# Patient Record
Sex: Female | Born: 1970
Health system: Southern US, Community
[De-identification: ages and names within clinical notes are randomized; demographics above are authoritative.]

## PROBLEM LIST (undated history)

## (undated) DIAGNOSIS — A64 Unspecified sexually transmitted disease: Secondary | ICD-10-CM

## (undated) HISTORY — DX: Unspecified sexually transmitted disease: A64

---

## 1999-02-13 ENCOUNTER — Emergency Department (HOSPITAL_COMMUNITY): Admission: EM | Admit: 1999-02-13 | Discharge: 1999-02-13 | Payer: Self-pay | Admitting: *Deleted

## 2003-12-04 ENCOUNTER — Other Ambulatory Visit: Admission: RE | Admit: 2003-12-04 | Discharge: 2003-12-04 | Payer: Self-pay | Admitting: Obstetrics and Gynecology

## 2005-01-27 ENCOUNTER — Other Ambulatory Visit: Admission: RE | Admit: 2005-01-27 | Discharge: 2005-01-27 | Payer: Self-pay | Admitting: Obstetrics and Gynecology

## 2006-05-13 ENCOUNTER — Other Ambulatory Visit: Admission: RE | Admit: 2006-05-13 | Discharge: 2006-05-13 | Payer: Self-pay | Admitting: Obstetrics & Gynecology

## 2007-10-27 ENCOUNTER — Other Ambulatory Visit: Admission: RE | Admit: 2007-10-27 | Discharge: 2007-10-27 | Payer: Self-pay | Admitting: Obstetrics and Gynecology

## 2007-12-29 ENCOUNTER — Other Ambulatory Visit: Admission: RE | Admit: 2007-12-29 | Discharge: 2007-12-29 | Payer: Self-pay | Admitting: Obstetrics and Gynecology

## 2011-08-10 ENCOUNTER — Other Ambulatory Visit: Payer: Self-pay | Admitting: Obstetrics and Gynecology

## 2011-08-10 DIAGNOSIS — Z1231 Encounter for screening mammogram for malignant neoplasm of breast: Secondary | ICD-10-CM

## 2011-08-19 ENCOUNTER — Ambulatory Visit
Admission: RE | Admit: 2011-08-19 | Discharge: 2011-08-19 | Disposition: A | Discharge status: Home / Self Care | Payer: PRIVATE HEALTH INSURANCE | Source: Ambulatory Visit | Attending: Obstetrics and Gynecology | Admitting: Obstetrics and Gynecology

## 2011-08-19 DIAGNOSIS — Z1231 Encounter for screening mammogram for malignant neoplasm of breast: Secondary | ICD-10-CM

## 2012-07-13 ENCOUNTER — Other Ambulatory Visit: Payer: Self-pay

## 2012-07-13 DIAGNOSIS — Z1231 Encounter for screening mammogram for malignant neoplasm of breast: Secondary | ICD-10-CM

## 2012-08-21 ENCOUNTER — Ambulatory Visit
Admission: RE | Admit: 2012-08-21 | Discharge: 2012-08-21 | Disposition: A | Discharge status: Home / Self Care | Payer: BC Managed Care – PPO | Source: Ambulatory Visit

## 2012-08-21 DIAGNOSIS — Z1231 Encounter for screening mammogram for malignant neoplasm of breast: Secondary | ICD-10-CM

## 2013-02-08 ENCOUNTER — Encounter: Payer: Self-pay | Admitting: Obstetrics and Gynecology

## 2013-02-09 ENCOUNTER — Ambulatory Visit (INDEPENDENT_AMBULATORY_CARE_PROVIDER_SITE_OTHER): Payer: BC Managed Care – PPO | Admitting: Nurse Practitioner

## 2013-02-09 ENCOUNTER — Encounter: Payer: Self-pay | Admitting: Nurse Practitioner

## 2013-02-09 VITALS — BP 100/72 | HR 72 | Resp 16 | Ht 67.0 in | Wt 175.0 lb

## 2013-02-09 DIAGNOSIS — Z01419 Encounter for gynecological examination (general) (routine) without abnormal findings: Secondary | ICD-10-CM

## 2013-02-09 NOTE — Patient Instructions (Signed)

## 2013-02-09 NOTE — Progress Notes (Signed)
Patient ID: Lauren Stevens, female   DOB: July 27, 1970, 42 y.o.   MRN: [id number] 42 y.o. [id number] Single Caucasian Fe here for annual exam.  Same partner for 15 years.  Not dating or sexually active in 2 years.  Patient's last menstrual period was 01/15/2013.          Sexually active: no  The current method of family planning is none.    Exercising: yes  Gym/ health club routine includes cardio and light weights.  Works with Psychologist, educational. Smoker:  no  Health Maintenance: Pap: 02/04/12, WNL, neg HR HPV MMG: 08/21/12, Bi--Rads 1: negative TDaP: 2014 Labs: PCP/work   reports that she has never smoked. She has never used smokeless tobacco. She reports that she drinks about 2.5 ounces of alcohol per week. She reports that she does not use illicit drugs.  Past Medical History  Diagnosis Date  . STD (sexually transmitted disease)     HSV 2    History reviewed. No pertinent past surgical history.  No current outpatient prescriptions on file.   No current facility-administered medications for this visit.    Family History  Problem Relation Age of Onset  . Diabetes Mother   . Hypertension Mother   . Hyperlipidemia Mother   . Heart failure Father   . Diabetes Father     ROS:  Pertinent items are noted in HPI.  Otherwise, a comprehensive ROS was negative.  Exam:   BP 100/72  Pulse 72  Resp 16  Ht 5\' 7"  (1.702 m)  Wt 175 lb (79.379 kg)  BMI 27.40 kg/m2  LMP 01/15/2013 Height: 5\' 7"  (170.2 cm)  Ht Readings from Last 3 Encounters:  02/09/13 5\' 7"  (1.702 m)    General appearance: alert, cooperative and appears stated age Head: Normocephalic, without obvious abnormality, atraumatic Neck: no adenopathy, supple, symmetrical, trachea midline and thyroid normal to inspection and palpation Lungs: clear to auscultation bilaterally Breasts: normal appearance, no masses or tenderness Heart: regular rate and rhythm Abdomen: soft, non-tender; no masses,  no organomegaly Extremities:  extremities normal, atraumatic, no cyanosis or edema Skin: Skin color, texture, turgor normal. No rashes or lesions Lymph nodes: Cervical, supraclavicular, and axillary nodes normal. No abnormal inguinal nodes palpated Neurologic: Grossly normal   Pelvic: External genitalia:  no lesions              Urethra:  normal appearing urethra with no masses, tenderness or lesions              Bartholin's and Skene's: normal                 Vagina: normal appearing vagina with normal color and discharge, no lesions              Cervix: anteverted              Pap taken: no Bimanual Exam:  Uterus:  normal size, contour, position, consistency, mobility, non-tender              Adnexa: no mass, fullness, tenderness               Rectovaginal: Confirms               Anus:  normal sphincter tone, no lesions  A:  Well Woman with normal exam  P:   Pap smear as per guidelines   Mammogram due 08/2013  Keep menses record  Counseled on breast self exam, adequate intake of calcium and vitamin D, diet and exercise  return annually or prn  An After Visit Summary was printed and given to the patient.

## 2013-02-14 NOTE — Progress Notes (Signed)
Encounter reviewed by Dr. Vander Kueker Silva.  

## 2013-07-30 ENCOUNTER — Other Ambulatory Visit: Payer: Self-pay

## 2013-07-30 DIAGNOSIS — Z1231 Encounter for screening mammogram for malignant neoplasm of breast: Secondary | ICD-10-CM

## 2013-08-24 ENCOUNTER — Ambulatory Visit: Payer: BC Managed Care – PPO

## 2013-09-04 ENCOUNTER — Ambulatory Visit: Payer: BC Managed Care – PPO

## 2013-10-02 ENCOUNTER — Other Ambulatory Visit: Payer: Self-pay | Admitting: Nurse Practitioner

## 2013-10-02 MED ORDER — VALACYCLOVIR HCL 500 MG PO TABS: 500.0000 mg | ORAL_TABLET | Freq: Two times a day (BID) | ORAL | Status: DC

## 2013-10-02 NOTE — Telephone Encounter (Signed)
Pt is requesting a refill for valtrex to be sent to [facility] [address]

## 2013-10-02 NOTE — Telephone Encounter (Signed)
Last AEX: 02/09/13 AEX scheduled: 02/23/15 with Ms Alexia Freestoneatty Last refilled: 04/07/11 #30/6 refills  Please advise  (Chart outside your door)

## 2013-10-03 NOTE — Telephone Encounter (Signed)
LM on patient's VM that rx has been sent to the pharmacy.

## 2014-01-29 ENCOUNTER — Ambulatory Visit: Payer: BC Managed Care – PPO | Admitting: Podiatry

## 2014-01-31 ENCOUNTER — Encounter: Payer: Self-pay | Admitting: Podiatry

## 2014-01-31 ENCOUNTER — Ambulatory Visit (INDEPENDENT_AMBULATORY_CARE_PROVIDER_SITE_OTHER): Payer: BC Managed Care – PPO | Admitting: Podiatry

## 2014-01-31 ENCOUNTER — Ambulatory Visit (INDEPENDENT_AMBULATORY_CARE_PROVIDER_SITE_OTHER): Payer: BC Managed Care – PPO

## 2014-01-31 VITALS — BP 103/71 | HR 80 | Resp 13 | Ht 67.0 in | Wt 180.0 lb

## 2014-01-31 DIAGNOSIS — M722 Plantar fascial fibromatosis: Secondary | ICD-10-CM | POA: Diagnosis not present

## 2014-01-31 MED ORDER — MELOXICAM 15 MG PO TABS: 15.0000 mg | ORAL_TABLET | Freq: Every day | ORAL | Status: DC

## 2014-01-31 MED ORDER — METHYLPREDNISOLONE (PAK) 4 MG PO TABS: ORAL_TABLET | ORAL | Status: DC

## 2014-01-31 NOTE — Progress Notes (Signed)
   Subjective:    Patient ID: Lauren CunasMichelle L Tamburo, female    DOB: 03/01/1971, 43 y.o.   MRN: [id number]  HPI Comments: Pt state she has had right plantar posterior heel pain for 3 months.  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     Review of Systems  Constitutional: Positive for unexpected weight change.  All other systems reviewed and are negative.      Objective:   Physical Exam: I have reviewed her past medical history medications allergy surgery social history and review of systems. Ulcers are strongly palpable bilateral. Neurologic sensorium is intact percent lasting monofilament. Deep tendon reflexes are intact bilateral. Muscle strength is 5 over 5 dorsiflexion plantar flexors and inverters everters all intrinsic musculature is intact. She has pain on palpation medial continue tubercle of the right heel. No pain on palpation of the tendo Achilles. No pain on medial and lateral compression of the calcaneus. Radiographic evaluation demonstrates soft tissue increase in density at the plantar fascial calcaneal insertion site of the right heel. No other major osseous abnormalities are noted. Cutaneous evaluation demonstrates supple well-hydrated cutis no erythema edema cellulitis drainage or odor.        Assessment & Plan:  Assessment: Plantar fasciitis right.  Plan: Discussed etiology pathology conservative versus surgical therapies. We discussed appropriate shoe gear stretching exercises ice therapy and shoe gear modifications. Injected the right heel today with Kenalog and local anesthetic. Placed her in a plantar fascial brace and dispensed a night splint. Wrote a prescription for Medrol Dosepak to be followed by meloxicam. She will call with any questions or concerns I will follow-up with her proximally one month.

## 2014-02-04 ENCOUNTER — Encounter: Payer: Self-pay | Admitting: Podiatry

## 2014-02-22 ENCOUNTER — Ambulatory Visit: Payer: BC Managed Care – PPO | Admitting: Nurse Practitioner

## 2014-02-22 ENCOUNTER — Telehealth: Payer: Self-pay | Admitting: Nurse Practitioner

## 2014-02-22 NOTE — Telephone Encounter (Signed)
Patient missed her appointment due to the fact she was dealing with her mother at the hospital who just had one of her kidneys removed. Patient to call back to reschedule and she is in recall.

## 2014-02-22 NOTE — Telephone Encounter (Signed)
This is an annual exam appointment. Recall is for annual exam only.  Routing to AshlandPatricia Rolen-Grubb, FNP for update.  Will close encounter.  Routing to provider for final review. Patient agreeable to disposition. Will close encounter

## 2014-03-07 ENCOUNTER — Ambulatory Visit: Payer: BC Managed Care – PPO | Admitting: Podiatry

## 2014-03-12 ENCOUNTER — Ambulatory Visit: Payer: BC Managed Care – PPO | Admitting: Podiatry

## 2014-04-25 ENCOUNTER — Encounter: Payer: Self-pay | Admitting: Podiatry

## 2014-04-25 ENCOUNTER — Ambulatory Visit (INDEPENDENT_AMBULATORY_CARE_PROVIDER_SITE_OTHER): Payer: BLUE CROSS/BLUE SHIELD | Admitting: Podiatry

## 2014-04-25 VITALS — BP 112/69 | HR 78 | Resp 12

## 2014-04-25 DIAGNOSIS — M722 Plantar fascial fibromatosis: Secondary | ICD-10-CM

## 2014-04-25 NOTE — Progress Notes (Signed)
She presents today for follow-up of plantar fasciitis to the right heel. She states that his heel sterile hurting but he feels much better after the injection.  Objective: Vital signs are stable she is alert and oriented 3 she has pain on palpation may continue tubercle of the right heel.  Assessment: Chronic intractable plantar fasciitis right heel.  Plan: I injected her right heel today with Kenalog and local anesthetic and we will get her started on orthotics next visit. She'll continue all other conservative therapies.

## 2014-05-23 ENCOUNTER — Ambulatory Visit (INDEPENDENT_AMBULATORY_CARE_PROVIDER_SITE_OTHER): Payer: BLUE CROSS/BLUE SHIELD | Admitting: Podiatry

## 2014-05-23 ENCOUNTER — Encounter: Payer: Self-pay | Admitting: Podiatry

## 2014-05-23 VITALS — BP 102/65 | HR 69 | Resp 16

## 2014-05-23 DIAGNOSIS — M722 Plantar fascial fibromatosis: Secondary | ICD-10-CM

## 2014-05-23 NOTE — Progress Notes (Signed)
She presents today for follow-up of her plantar fasciitis right foot. She states that it doesn't hurt every day but it hurts just enough to really bother me on the days that he does hurt. She states that she is no longer taking the meloxicam.  Objective: Vital signs are stable she's alert and oriented 3. Pulses are palpable. She has pain on palpation medial calcaneal tubercle of the right foot.  Assessment: Chronic intractable plantar fasciitis right foot.  Plan: She was scanned for set of orthotics today and we restarted I will meloxicam. We will continue to use all other conservative therapies that she knows to these orthotics arrive. Once they arrive we will reinject her and possibly even utilize another steroid pack to help reduce the inflammation.

## 2014-06-14 ENCOUNTER — Ambulatory Visit: Payer: BLUE CROSS/BLUE SHIELD | Admitting: *Deleted

## 2014-06-14 DIAGNOSIS — M722 Plantar fascial fibromatosis: Secondary | ICD-10-CM

## 2014-06-14 NOTE — Progress Notes (Signed)
Patient ID: Lauren Stevens, female   DOB: 08/28/1970, 44 y.o.   MRN: [id number] PICKING UP INSERTS

## 2014-06-14 NOTE — Patient Instructions (Signed)

## 2014-06-25 ENCOUNTER — Other Ambulatory Visit: Payer: Self-pay

## 2014-06-25 DIAGNOSIS — Z1231 Encounter for screening mammogram for malignant neoplasm of breast: Secondary | ICD-10-CM

## 2014-08-07 ENCOUNTER — Ambulatory Visit
Admission: RE | Admit: 2014-08-07 | Discharge: 2014-08-07 | Disposition: A | Discharge status: Home / Self Care | Payer: BLUE CROSS/BLUE SHIELD | Source: Ambulatory Visit

## 2014-08-07 DIAGNOSIS — Z1231 Encounter for screening mammogram for malignant neoplasm of breast: Secondary | ICD-10-CM

## 2014-08-08 ENCOUNTER — Other Ambulatory Visit: Payer: Self-pay | Admitting: Nurse Practitioner

## 2014-08-08 DIAGNOSIS — R928 Other abnormal and inconclusive findings on diagnostic imaging of breast: Secondary | ICD-10-CM

## 2014-08-13 ENCOUNTER — Other Ambulatory Visit: Payer: Self-pay | Admitting: Nurse Practitioner

## 2014-08-13 ENCOUNTER — Ambulatory Visit
Admission: RE | Admit: 2014-08-13 | Discharge: 2014-08-13 | Disposition: A | Discharge status: Home / Self Care | Payer: BLUE CROSS/BLUE SHIELD | Source: Ambulatory Visit | Attending: Nurse Practitioner | Admitting: Nurse Practitioner

## 2014-08-13 DIAGNOSIS — R928 Other abnormal and inconclusive findings on diagnostic imaging of breast: Secondary | ICD-10-CM

## 2014-08-15 ENCOUNTER — Other Ambulatory Visit: Payer: Self-pay | Admitting: Nurse Practitioner

## 2014-08-15 ENCOUNTER — Ambulatory Visit
Admission: RE | Admit: 2014-08-15 | Discharge: 2014-08-15 | Disposition: A | Discharge status: Home / Self Care | Payer: BLUE CROSS/BLUE SHIELD | Source: Ambulatory Visit | Attending: Nurse Practitioner | Admitting: Nurse Practitioner

## 2014-08-15 DIAGNOSIS — R928 Other abnormal and inconclusive findings on diagnostic imaging of breast: Secondary | ICD-10-CM

## 2014-10-29 ENCOUNTER — Encounter: Payer: Self-pay | Admitting: Nurse Practitioner

## 2015-01-10 ENCOUNTER — Other Ambulatory Visit: Payer: Self-pay | Admitting: Nurse Practitioner

## 2015-01-10 ENCOUNTER — Other Ambulatory Visit: Payer: Self-pay

## 2015-01-10 DIAGNOSIS — R921 Mammographic calcification found on diagnostic imaging of breast: Secondary | ICD-10-CM

## 2015-02-17 ENCOUNTER — Ambulatory Visit
Admission: RE | Admit: 2015-02-17 | Discharge: 2015-02-17 | Disposition: A | Discharge status: Home / Self Care | Payer: BLUE CROSS/BLUE SHIELD | Source: Ambulatory Visit | Attending: Nurse Practitioner | Admitting: Nurse Practitioner

## 2015-02-17 DIAGNOSIS — R921 Mammographic calcification found on diagnostic imaging of breast: Secondary | ICD-10-CM

## 2015-05-23 ENCOUNTER — Ambulatory Visit (INDEPENDENT_AMBULATORY_CARE_PROVIDER_SITE_OTHER): Payer: BLUE CROSS/BLUE SHIELD | Admitting: Nurse Practitioner

## 2015-05-23 ENCOUNTER — Encounter: Payer: Self-pay | Admitting: Nurse Practitioner

## 2015-05-23 VITALS — BP 108/64 | HR 68 | Ht 67.0 in | Wt 169.0 lb

## 2015-05-23 DIAGNOSIS — Z01419 Encounter for gynecological examination (general) (routine) without abnormal findings: Secondary | ICD-10-CM

## 2015-05-23 DIAGNOSIS — Z0000 Encounter for general adult medical examination without abnormal findings: Secondary | ICD-10-CM

## 2015-05-23 MED ORDER — NYSTATIN-TRIAMCINOLONE 100000-0.1 UNIT/GM-% EX OINT: 1.0000 "application " | TOPICAL_OINTMENT | Freq: Two times a day (BID) | CUTANEOUS | Status: DC

## 2015-05-23 MED ORDER — VALACYCLOVIR HCL 500 MG PO TABS: 500.0000 mg | ORAL_TABLET | Freq: Two times a day (BID) | ORAL | Status: DC

## 2015-05-23 MED ORDER — CLOBETASOL PROPIONATE 0.05 % EX OINT: 1.0000 "application " | TOPICAL_OINTMENT | Freq: Two times a day (BID) | CUTANEOUS | Status: DC

## 2015-05-23 NOTE — Patient Instructions (Signed)

## 2015-05-23 NOTE — Progress Notes (Signed)
Patient ID: Lauren Stevens, female   DOB: [birth year], 45 y.o.   MRN: [id number] 45 y.o. [id number] Single  Caucasian Fe here for annual exam.  There was a lapse of care from 2014 secondary to mother's illness.   Menses is still regular and last 4-5 days.   Then 1 day is very heavy with changing a super tampon every hour for about 6 hours.  No cramps.  Ended last relationship 4 yrs ago.  No concerns about STD's.   Patient's last menstrual period was 05/18/2015 (exact date).          Sexually active: Yes.  Not currently. The current method of family planning is abstinence.    Exercising: Yes.    Home exercise routine includes cardio and weights 3 times per week with trainer. Smoker:  no  Health Maintenance: Pap: 02/04/12, WNL, neg HR HPV MMG: bilateral calcifications - due for diagnostic on the left in May TDaP: 04/05/13  HIV: note given to get done fasting at her work Labs: order usually given to have drawn at place of work   reports that she has never smoked. She has never used smokeless tobacco. She reports that she drinks about 2.5 - 3.0 oz of alcohol per week. She reports that she does not use illicit drugs.  Past Medical History  Diagnosis Date  . STD (sexually transmitted disease)     HSV 2    History reviewed. No pertinent past surgical history.  Current Outpatient Prescriptions  Medication Sig Dispense Refill  . meloxicam (MOBIC) 15 MG tablet Take 1 tablet (15 mg total) by mouth daily. 30 tablet 3  . metroNIDAZOLE (METROGEL) 1 % gel Apply topically daily.    . valACYclovir (VALTREX) 500 MG tablet Take 1 tablet (500 mg total) by mouth 2 (two) times daily. 30 tablet 5  . nystatin-triamcinolone ointment (MYCOLOG) Apply 1 application topically 2 (two) times daily. 30 g 3   No current facility-administered medications for this visit.    Family History  Problem Relation Age of Onset  . Diabetes Mother   . Hypertension Mother   . Hyperlipidemia Mother   . Kidney cancer Mother     ?  removed right kidney  . Heart failure Father   . Diabetes Father     ROS:  Pertinent items are noted in HPI.  Otherwise, a comprehensive ROS was negative.  Exam:   BP 108/64 mmHg  Pulse 68  Ht  (1.702 m)  Wt 169 lb (76.658 kg)  BMI 26.46 kg/m2  LMP 05/18/2015 (Exact Date) Height:  (170.2 cm) Ht Readings from Last 3 Encounters:  05/23/15  (1.702 m)  01/31/14  (1.702 m)  02/09/13  (1.702 m)    General appearance: alert, cooperative and appears stated age Head: Normocephalic, without obvious abnormality, atraumatic Neck: no adenopathy, supple, symmetrical, trachea midline and thyroid normal to inspection and palpation Lungs: clear to auscultation bilaterally Breasts: normal appearance, no masses or tenderness Heart: regular rate and rhythm Abdomen: soft, non-tender; no masses,  no organomegaly Extremities: extremities normal, atraumatic, no cyanosis or edema Skin: Skin color, texture, turgor normal. No rashes or lesions Lymph nodes: Cervical, supraclavicular, and axillary nodes normal. No abnormal inguinal nodes palpated Neurologic: Grossly normal   Pelvic: External genitalia:  no lesions, but changes are noted with slight hypopigmented area at the outer edges of the labia minora.              Urethra:  normal appearing urethra with  no masses, tenderness or lesions              Bartholin's and Skene's: normal                 Vagina: normal appearing vagina with normal color and discharge, no lesions              Cervix: anteverted              Pap taken: Yes.   Bimanual Exam:  Uterus:  normal size, contour, position, consistency, mobility, non-tender              Adnexa: no mass, fullness, tenderness               Rectovaginal: Confirms               Anus:  normal sphincter tone, no lesions  Chaperone present: yes  A:  Well Woman with normal exam  Perimenopausal  History of Vulvitis and usually does well with Triamcinolone and Nystatin  Not  SA  History of HSV  P:   Reviewed health and wellness pertinent to exam  Pap smear as above  Mammogram is due 08/2015 diagnostic on left  Refill on Valtrex for a year  Refill on Triamcinolone and Nystatin for use prn  Counseled on breast self exam, mammography screening, adequate intake of calcium and vitamin D, diet and exercise return annually or prn  An After Visit Summary was printed and given to the patient.

## 2015-05-25 NOTE — Progress Notes (Signed)
Encounter reviewed by Dr. Brook Amundson C. Silva.  

## 2015-05-27 LAB — IPS PAP TEST WITH HPV

## 2015-07-23 ENCOUNTER — Other Ambulatory Visit: Payer: Self-pay | Admitting: Nurse Practitioner

## 2015-07-23 DIAGNOSIS — R921 Mammographic calcification found on diagnostic imaging of breast: Secondary | ICD-10-CM

## 2015-09-17 ENCOUNTER — Ambulatory Visit
Admission: RE | Admit: 2015-09-17 | Discharge: 2015-09-17 | Disposition: A | Discharge status: Home / Self Care | Payer: BLUE CROSS/BLUE SHIELD | Source: Ambulatory Visit | Attending: Nurse Practitioner | Admitting: Nurse Practitioner

## 2015-09-17 ENCOUNTER — Other Ambulatory Visit: Payer: Self-pay | Admitting: Nurse Practitioner

## 2015-09-17 DIAGNOSIS — R921 Mammographic calcification found on diagnostic imaging of breast: Secondary | ICD-10-CM

## 2016-01-13 ENCOUNTER — Other Ambulatory Visit: Payer: Self-pay | Admitting: Nurse Practitioner

## 2016-01-13 DIAGNOSIS — Z1231 Encounter for screening mammogram for malignant neoplasm of breast: Secondary | ICD-10-CM

## 2016-02-06 ENCOUNTER — Ambulatory Visit: Payer: BLUE CROSS/BLUE SHIELD

## 2016-03-02 ENCOUNTER — Ambulatory Visit: Payer: BLUE CROSS/BLUE SHIELD | Admitting: Nurse Practitioner

## 2016-03-03 ENCOUNTER — Encounter: Payer: Self-pay | Admitting: Nurse Practitioner

## 2016-03-03 ENCOUNTER — Ambulatory Visit (INDEPENDENT_AMBULATORY_CARE_PROVIDER_SITE_OTHER): Payer: No Typology Code available for payment source | Admitting: Nurse Practitioner

## 2016-03-03 VITALS — BP 116/74 | HR 72 | Ht 67.0 in | Wt 165.0 lb

## 2016-03-03 DIAGNOSIS — N760 Acute vaginitis: Secondary | ICD-10-CM

## 2016-03-03 NOTE — Progress Notes (Signed)
45 y.o. Single Caucasian female [phone] here with complaint of vaginal odor for 2 months,  using Refresh OTC that helps a little. Describes discharge as thin and white at times.   Onset of symptoms ~ 60 days ago. Denies new personal products or vaginal dryness. No real STD concerns.but does have a new partner since August.  He was a former partner 25 yrs ago.  Both are always very busy and working a lot, does not believe that he has another partner. Urinary symptoms none . Contraception is condoms.  Had not been SA for over 4 yrs prior to this partner.  LMP: 02/08/16.   O:  Healthy female WDWN Affect: normal, orientation x 3  Exam: no distress Abdomen:  Soft and non tender Lymph node: no enlargement or tenderness Pelvic exam: External genital: normal female BUS: negative Vagina: white thin discharge noted.  Affirm taken. Cervix: normal, non tender, no CMT Uterus: normal, non tender Adnexa:normal, non tender, no masses or fullness noted    A: R/O Vaginitis  R/O GC/Chl - (doubtful)   P: Discussed findings of vaginitis and etiology. Discussed Aveeno or baking soda sitz bath for comfort. Avoid moist clothes or pads for extended period of time. If working out in gym clothes or swim suits for long periods of time change underwear or bottoms of swimsuit if possible. Olive Oil/Coconut Oil use for skin protection prior to activity can be used to external skin.  Rx: none at this time - if BV she prefers Metrogel  Follow with Affirm, GC and Chl  RV prn

## 2016-03-03 NOTE — Patient Instructions (Signed)

## 2016-03-04 LAB — WET PREP BY MOLECULAR PROBE
Candida species: NEGATIVE
GARDNERELLA VAGINALIS: NEGATIVE
Trichomonas vaginosis: NEGATIVE

## 2016-03-04 NOTE — Progress Notes (Signed)
Reviewed personally.  M. Suzanne Danai Gotto, MD.  

## 2016-03-05 LAB — GC/CHLAMYDIA PROBE AMP
CT PROBE, AMP APTIMA: NOT DETECTED
GC Probe RNA: NOT DETECTED

## 2016-03-30 ENCOUNTER — Ambulatory Visit
Admission: RE | Admit: 2016-03-30 | Discharge: 2016-03-30 | Disposition: A | Discharge status: Home / Self Care | Payer: PRIVATE HEALTH INSURANCE | Source: Ambulatory Visit | Attending: Nurse Practitioner | Admitting: Nurse Practitioner

## 2016-03-30 DIAGNOSIS — Z1231 Encounter for screening mammogram for malignant neoplasm of breast: Secondary | ICD-10-CM

## 2016-04-18 ENCOUNTER — Encounter (HOSPITAL_COMMUNITY): Payer: Self-pay

## 2016-04-18 ENCOUNTER — Emergency Department (HOSPITAL_COMMUNITY)
Admission: EM | Admit: 2016-04-18 | Discharge: 2016-04-18 | Disposition: A | Discharge status: Home / Self Care | Payer: PRIVATE HEALTH INSURANCE | Attending: Emergency Medicine | Admitting: Emergency Medicine

## 2016-04-18 DIAGNOSIS — Z79899 Other long term (current) drug therapy: Secondary | ICD-10-CM | POA: Diagnosis not present

## 2016-04-18 DIAGNOSIS — Y999 Unspecified external cause status: Secondary | ICD-10-CM | POA: Diagnosis not present

## 2016-04-18 DIAGNOSIS — Y929 Unspecified place or not applicable: Secondary | ICD-10-CM | POA: Insufficient documentation

## 2016-04-18 DIAGNOSIS — S61451A Open bite of right hand, initial encounter: Secondary | ICD-10-CM | POA: Diagnosis not present

## 2016-04-18 DIAGNOSIS — W540XXA Bitten by dog, initial encounter: Secondary | ICD-10-CM | POA: Diagnosis not present

## 2016-04-18 DIAGNOSIS — S51851A Open bite of right forearm, initial encounter: Secondary | ICD-10-CM | POA: Diagnosis not present

## 2016-04-18 DIAGNOSIS — Y939 Activity, unspecified: Secondary | ICD-10-CM | POA: Insufficient documentation

## 2016-04-18 MED ORDER — METRONIDAZOLE 500 MG PO TABS
500.0000 mg | ORAL_TABLET | Freq: Once | ORAL | Status: AC
  Administered 2016-04-18: 500 mg via ORAL
  Filled 2016-04-18: qty 1

## 2016-04-18 MED ORDER — SULFAMETHOXAZOLE-TRIMETHOPRIM 800-160 MG PO TABS: 1.0000 | ORAL_TABLET | Freq: Two times a day (BID) | ORAL | 0 refills | Status: AC

## 2016-04-18 MED ORDER — SULFAMETHOXAZOLE-TRIMETHOPRIM 800-160 MG PO TABS
1.0000 | ORAL_TABLET | Freq: Once | ORAL | Status: AC
  Administered 2016-04-18: 1 via ORAL
  Filled 2016-04-18: qty 1

## 2016-04-18 MED ORDER — METRONIDAZOLE 500 MG PO TABS: 500.0000 mg | ORAL_TABLET | Freq: Three times a day (TID) | ORAL | 0 refills | Status: DC

## 2016-04-18 NOTE — ED Provider Notes (Signed)
[facility]-EMERGENCY DEPT Provider Note   CSN: [id number] Arrival date & time: 04/18/16  1907     History   Chief Complaint Chief Complaint  Patient presents with  . Animal Bite    HPI Lauren Stevens is a 46 y.o. female who presents emergency approach chief complaint of dog bite to the right hand and forearm. Patient was breaking up a fight between her 2 dogs. She was bitten on the right hand and right forearm. She has no lacerations. Multiple puncture wounds. Patient is up-to-date on her tetanus vaccination and her dogs are all up-to-date on their rabies vaccinations. The patient is about 4 hours out from the initial wound. She denies any numbness or tingling in her fingers. She has a known allergy to penicillin and tetracycline.  HPI  Past Medical History:  Diagnosis Date  . STD (sexually transmitted disease)    HSV 2    There are no active problems to display for this patient.   History reviewed. No pertinent surgical history.  OB History    Gravida Para Term Preterm AB Living   3 1 1  0 2 1   SAB TAB Ectopic Multiple Live Births   0 0 0 0 1       Home Medications    Prior to Admission medications   Medication Sig Start Date End Date Taking? Authorizing Provider  meloxicam (MOBIC) 15 MG tablet Take 1 tablet (15 mg total) by mouth daily. 01/31/14   Max T Hyatt, DPM  valACYclovir (VALTREX) 500 MG tablet Take 1 tablet (500 mg total) by mouth 2 (two) times daily. 05/23/15   Ria CommentPatricia Grubb, FNP    Family History Family History  Problem Relation Age of Onset  . Diabetes Mother   . Hypertension Mother   . Hyperlipidemia Mother   . Kidney cancer Mother     ? removed right kidney  . Heart failure Father   . Diabetes Father     Social History Social History  Substance Use Topics  . Smoking status: Never Smoker  . Smokeless tobacco: Never Used  . Alcohol use 2.5 - 3.0 oz/week    5 - 6 Standard drinks or equivalent per week     Comment: moderate wine drinker      Allergies   Penicillins; Other; and Tetracyclines & related   Review of Systems Review of Systems Positive for wound Negative for numbness, tingling, weakness  Physical Exam Updated Vital Signs BP 141/97   Pulse 96   Temp 97.9 F (36.6 C) (Oral)   Resp 18   LMP 03/07/2016 (Approximate)   SpO2 99%   Physical Exam  Constitutional: She is oriented to person, place, and time. She appears well-developed and well-nourished. No distress.  HENT:  Head: Normocephalic and atraumatic.  Eyes: Conjunctivae are normal. No scleral icterus.  Neck: Normal range of motion.  Cardiovascular: Normal rate, regular rhythm and normal heart sounds.  Exam reveals no gallop and no friction rub.   No murmur heard. Pulmonary/Chest: Effort normal and breath sounds normal. No respiratory distress.  Abdominal: Soft. Bowel sounds are normal. She exhibits no distension and no mass. There is no tenderness. There is no guarding.  Musculoskeletal:  Multiple small puncture wounds to the right thumb, dorsum of the right hand and mid forearm. Full strength and range of motion of each individual finger. Strong grip strength. There is some hematoma in the mid forearm with swelling, however. Compartments are soft. She is neurovascularly intact.  Neurological: She is alert  and oriented to person, place, and time.  Skin: Skin is warm and dry. She is not diaphoretic.  Nursing note and vitals reviewed.    ED Treatments / Results  Labs (all labs ordered are listed, but only abnormal results are displayed) Labs Reviewed - No data to display  EKG  EKG Interpretation None       Radiology No results found.  Procedures Procedures (including critical care time)  Medications Ordered in ED Medications - No data to display   Initial Impression / Assessment and Plan / ED Course  I have reviewed the triage vital signs and the nursing notes.  Pertinent labs & imaging results that were available during my  care of the patient were reviewed by me and considered in my medical decision making (see chart for details).  Clinical Course     Patient with dog bite to the right hand and forearm. No sign of tendon disruption or fractures. Her tetanus is up-to-date. Patient will be placed on Bactrim and Flagyl for treatment of bite wounds. I discussed return precautions with the patient. She appears safe for discharge at this time  Final Clinical Impressions(s) / ED Diagnoses   Final diagnoses:  Dog bite of right hand, initial encounter  Dog bite of right forearm, initial encounter    New Prescriptions New Prescriptions   No medications on file     Arthor Captain, PA-C 04/18/16 2130    Mancel Bale, MD 04/19/16 0009

## 2016-04-18 NOTE — Discharge Instructions (Signed)
Please take all of your antibiotics until finished!   You may develop abdominal discomfort or diarrhea from the antibiotic.  You may help offset this with probiotics which you can buy or get in yogurt. Do not eat  or take the probiotics until 2 hours after your antibiotic.     Contact a health care provider if: You have increasing redness, swelling, or pain at the site of your wound. You have a general feeling of sickness (malaise). You feel nauseous or you vomit. You have pain that does not get better. Get help right away if: You have a red streak extending away from your wound. You have fluid, blood, or pus coming from your wound. You have a fever or chills. You have trouble moving your injured area. You have numbness or tingling extending beyond the wound.

## 2016-04-18 NOTE — ED Triage Notes (Signed)
Pt presents with c/o dog bite to her right hand. Pt reports that her two dogs got into a dog fight and that she was bitten attempting to separate them. Pt has some bite marks to her arm in several spots, bleeding is controlled at this time. Pt reports that all of her dogs shots are up to date.

## 2016-04-18 NOTE — ED Notes (Signed)
Bed: WTR5 Expected date:  Expected time:  Means of arrival:  Comments: 

## 2016-04-18 NOTE — ED Notes (Signed)
Patient was alert, oriented and stable upon discharge. RN went over AVS and patient had no further questions.  

## 2016-04-18 NOTE — ED Triage Notes (Signed)
After initial assessment, bandage was removed and pt was bleeding a significant amount. Controlled again at this time.

## 2016-05-28 ENCOUNTER — Ambulatory Visit: Payer: BLUE CROSS/BLUE SHIELD | Admitting: Nurse Practitioner

## 2016-06-16 ENCOUNTER — Encounter: Payer: Self-pay | Admitting: Nurse Practitioner

## 2016-06-16 NOTE — Progress Notes (Signed)
Patient ID: Lauren Stevens, female   DOB: June 20, 1970, 46 y.o.   MRN: [id number]  46 y.o. [id number] Single Caucasian Fe here for annual exam.  No new diagnosis this past year. Same partner since august 2017.  GC and Chl done 03/03/16 and was negative.  Menses still regular and last 3-4 days.  Heavier for 1/2 day then light.  Now off Nuva Ring for several yrs. (maybe related to hair thinning).  Patient's last menstrual period was 05/30/2016 (exact date).          Sexually active: Yes.    The current method of family planning is vasectomy.    Exercising: Yes.    Home exercise routine includes calisthenics and cardio and weights. Smoker:  no  Health Maintenance: Pap: 05/23/15, Negative with neg HR HPV  02/04/12, Negative with neg HR HPV MMG: 03/30/16, 3D, Bi-Rads 1:  Negative TDaP: 04/05/13    HIV: will return for labs Labs: will return for labs   reports that she has never smoked. She has never used smokeless tobacco. She reports that she drinks about 2.5 - 3.0 oz of alcohol per week . She reports that she does not use drugs.  Past Medical History:  Diagnosis Date  . STD (sexually transmitted disease)    HSV 2    No past surgical history on file.  Current Outpatient Prescriptions  Medication Sig Dispense Refill  . valACYclovir (VALTREX) 500 MG tablet Take 1 tablet (500 mg total) by mouth 2 (two) times daily. 30 tablet 5   No current facility-administered medications for this visit.     Family History  Problem Relation Age of Onset  . Diabetes Mother   . Hypertension Mother   . Hyperlipidemia Mother   . Kidney cancer Mother     ? removed right kidney  . Heart failure Father   . Diabetes Father   . Heart attack Maternal Grandfather     ROS:  Pertinent items are noted in HPI.  Otherwise, a comprehensive ROS was negative.  Exam:   BP 110/72 (BP Location: Right Arm, Patient Position: Sitting, Cuff Size: Normal)   Pulse 64   Ht 5\' 7"  (1.702 m)   Wt 165 lb (74.8 kg)   LMP  05/30/2016 (Exact Date)   BMI 25.84 kg/m  Height: 5\' 7"  (170.2 cm) Ht Readings from Last 3 Encounters:  06/18/16 5\' 7"  (1.702 m)  03/03/16 5\' 7"  (1.702 m)  05/23/15 5\' 7"  (1.702 m)    General appearance: alert, cooperative and appears stated age Head: Normocephalic, without obvious abnormality, atraumatic Neck: no adenopathy, supple, symmetrical, trachea midline and thyroid normal to inspection and palpation Lungs: clear to auscultation bilaterally Breasts: normal appearance, no masses or tenderness Heart: regular rate and rhythm Abdomen: soft, non-tender; no masses,  no organomegaly Extremities: extremities normal, atraumatic, no cyanosis or edema Skin: Skin color, texture, turgor normal. No rashes or lesions Lymph nodes: Cervical, supraclavicular, and axillary nodes normal. No abnormal inguinal nodes palpated Neurologic: Grossly normal   Pelvic: External genitalia:  no lesions              Urethra:  normal appearing urethra with no masses, tenderness or lesions              Bartholin's and Skene's: normal                 Vagina: normal appearing vagina with normal color and discharge, no lesions  Cervix: anteverted              Pap taken: No. Bimanual Exam:  Uterus:  normal size, contour, position, consistency, mobility, non-tender              Adnexa: no mass, fullness, tenderness               Rectovaginal: Confirms               Anus:  normal sphincter tone, no lesions  Chaperone present: yes  A:  Well Woman with normal exam  Perimenopausal             History of Vulvitis and usually does well with Triamcinolone and Nystatin             Not SA             History of HSV   P:   Reviewed health and wellness pertinent to exam  Pap smear as above  Mammogram is due 12/18  Refill on Valtrex for 1 yr  Will return for fasting labs  Counseled on breast self exam, mammography screening, adequate intake of calcium and vitamin D, diet and exercise, Kegel's  exercises return annually or prn  An After Visit Summary was printed and given to the patient.

## 2016-06-18 ENCOUNTER — Ambulatory Visit (INDEPENDENT_AMBULATORY_CARE_PROVIDER_SITE_OTHER): Payer: No Typology Code available for payment source | Admitting: Nurse Practitioner

## 2016-06-18 ENCOUNTER — Encounter: Payer: Self-pay | Admitting: Nurse Practitioner

## 2016-06-18 VITALS — BP 110/72 | HR 64 | Ht 67.0 in | Wt 165.0 lb

## 2016-06-18 DIAGNOSIS — Z01419 Encounter for gynecological examination (general) (routine) without abnormal findings: Secondary | ICD-10-CM

## 2016-06-18 DIAGNOSIS — Z0000 Encounter for general adult medical examination without abnormal findings: Secondary | ICD-10-CM | POA: Diagnosis not present

## 2016-06-18 MED ORDER — VALACYCLOVIR HCL 500 MG PO TABS: 500.0000 mg | ORAL_TABLET | Freq: Two times a day (BID) | ORAL | 4 refills | Status: DC

## 2016-06-18 NOTE — Patient Instructions (Signed)

## 2016-06-20 NOTE — Progress Notes (Signed)
Encounter reviewed by Dr. Brook Amundson C. Silva.  

## 2016-07-06 ENCOUNTER — Other Ambulatory Visit: Payer: Self-pay | Admitting: *Deleted

## 2016-07-06 ENCOUNTER — Other Ambulatory Visit (INDEPENDENT_AMBULATORY_CARE_PROVIDER_SITE_OTHER): Payer: No Typology Code available for payment source

## 2016-07-06 DIAGNOSIS — Z0000 Encounter for general adult medical examination without abnormal findings: Secondary | ICD-10-CM

## 2016-07-06 DIAGNOSIS — Z01419 Encounter for gynecological examination (general) (routine) without abnormal findings: Secondary | ICD-10-CM

## 2016-07-06 LAB — LIPID PANEL
CHOLESTEROL: 161 mg/dL (ref <200)
HDL: 77 mg/dL (ref >50)
LDL CALC: 72 mg/dL (ref <100)
TRIGLYCERIDES: 60 mg/dL (ref <150)
Total CHOL/HDL Ratio: 2.1 Ratio (ref <5.0)
VLDL: 12 mg/dL (ref <30)

## 2016-07-06 LAB — COMPREHENSIVE METABOLIC PANEL
ALBUMIN: 3.7 g/dL (ref 3.6–5.1)
ALK PHOS: 30 U/L — AB (ref 33–115)
ALT: 10 U/L (ref 6–29)
AST: 14 U/L (ref 10–30)
BILIRUBIN TOTAL: 0.7 mg/dL (ref 0.2–1.2)
BUN: 10 mg/dL (ref 7–25)
CALCIUM: 8.8 mg/dL (ref 8.6–10.2)
CO2: 23 mmol/L (ref 20–31)
Chloride: 106 mmol/L (ref 98–110)
Creat: 0.91 mg/dL (ref 0.50–1.10)
Glucose, Bld: 81 mg/dL (ref 65–99)
Potassium: 4.4 mmol/L (ref 3.5–5.3)
Sodium: 139 mmol/L (ref 135–146)
TOTAL PROTEIN: 6.2 g/dL (ref 6.1–8.1)

## 2016-07-06 LAB — CBC WITH DIFFERENTIAL/PLATELET
BASOS ABS: 0 {cells}/uL (ref 0–200)
Basophils Relative: 0 %
Eosinophils Absolute: 0 cells/uL — ABNORMAL LOW (ref 15–500)
Eosinophils Relative: 0 %
HEMATOCRIT: 42.7 % (ref 35.0–45.0)
HEMOGLOBIN: 14.2 g/dL (ref 11.7–15.5)
Lymphocytes Relative: 36 %
Lymphs Abs: 1656 cells/uL (ref 850–3900)
MCH: 30.7 pg (ref 27.0–33.0)
MCHC: 33.3 g/dL (ref 32.0–36.0)
MCV: 92.4 fL (ref 80.0–100.0)
MPV: 10.7 fL (ref 7.5–12.5)
Monocytes Absolute: 276 cells/uL (ref 200–950)
Monocytes Relative: 6 %
NEUTROS PCT: 58 %
Neutro Abs: 2668 cells/uL (ref 1500–7800)
Platelets: 189 10*3/uL (ref 140–400)
RBC: 4.62 MIL/uL (ref 3.80–5.10)
RDW: 13.4 % (ref 11.0–15.0)
WBC: 4.6 10*3/uL (ref 3.8–10.8)

## 2016-07-06 LAB — TSH: TSH: 1 m[IU]/L

## 2016-07-06 NOTE — Addendum Note (Signed)
Addended by: Shelda Jakes E on: 07/06/2016 11:01 AM   Modules accepted: Orders

## 2016-07-07 LAB — HIV ANTIBODY (ROUTINE TESTING W REFLEX): HIV: NONREACTIVE

## 2016-07-07 LAB — HEMOGLOBIN A1C
Hgb A1c MFr Bld: 4.4 % (ref <5.7)
MEAN PLASMA GLUCOSE: 80 mg/dL

## 2016-07-07 LAB — VITAMIN D 25 HYDROXY (VIT D DEFICIENCY, FRACTURES): Vit D, 25-Hydroxy: 39 ng/mL (ref 30–100)

## 2016-11-03 ENCOUNTER — Telehealth: Payer: Self-pay | Admitting: Obstetrics and Gynecology

## 2016-11-03 NOTE — Telephone Encounter (Signed)
Left patient a message to call back to reschedule a future appointment that was cancelled by the provider for [facility]EX. °

## 2016-11-03 NOTE — Telephone Encounter (Signed)
Left patient a message to call back to reschedule a future appointment that was cancelled by the provider. °

## 2016-11-22 ENCOUNTER — Telehealth: Payer: Self-pay | Admitting: Certified Nurse Midwife

## 2016-11-22 NOTE — Telephone Encounter (Signed)
Patient is having spotting after sex.  States past few weeks it has been everytime.

## 2016-11-22 NOTE — Telephone Encounter (Signed)
Spoke with patient. Patient states that she has been having spotting with intercourse. Spotting lasts for 10 minutes and then stops. Denies any pain with intercourse. Partner has a vasectomy. Does not use lubrication. Patient is concerned as this is occurring each time she has intercourse. Advised she will need to be seen for further evaluation. Patient is agreeable. Offered appointment on 8/21 but patient declines. Appointment scheduled for 11/26/16 at 12:45 pm with Leota Sauers CNM. Patient is agreeable to date and time.  Routing to provider for final review. Patient agreeable to disposition. Will close encounter.

## 2016-11-26 ENCOUNTER — Encounter: Payer: Self-pay | Admitting: Certified Nurse Midwife

## 2016-11-26 ENCOUNTER — Ambulatory Visit (INDEPENDENT_AMBULATORY_CARE_PROVIDER_SITE_OTHER): Payer: No Typology Code available for payment source | Admitting: Certified Nurse Midwife

## 2016-11-26 VITALS — BP 112/70 | HR 64 | Resp 16 | Ht 67.0 in | Wt 164.0 lb

## 2016-11-26 DIAGNOSIS — N938 Other specified abnormal uterine and vaginal bleeding: Secondary | ICD-10-CM

## 2016-11-26 DIAGNOSIS — N898 Other specified noninflammatory disorders of vagina: Secondary | ICD-10-CM

## 2016-11-26 DIAGNOSIS — Z3041 Encounter for surveillance of contraceptive pills: Secondary | ICD-10-CM

## 2016-11-26 NOTE — Progress Notes (Signed)
Subjective:     Patient ID: Lauren Stevens, female   DOB: 12/27/1970, 46 y.o.   MRN: [id number]  HPI    46 yo [id number] here with complaint of spotting with intercourse for last 6 months, not all the time, have noted slightly more frequently, only has sexually every 4 weeks and more active during that time. Denies vaginal discharge change, itching or burning or odor. History of HSV 2, recent outbreak. Same partner of one year, no STD concerns. No oral stimulation or hand stimulation, or larger partner. Had been one year without sexual activity when started relationship. Patient works in [employer] and has been on continuous use OCP for cycle control with no menses for the past 3-6 months,and had normal period in June. Last episode of spotting was this weekend, which should have period week and on first week of pill pack today. Patient would be fine to stop OCP to see if this is the problem. Partner has had vasectomy. No other health issues today.   Review of Systems  Constitutional: Negative.   Respiratory: Negative.   Cardiovascular: Negative.   Gastrointestinal: Negative.   Genitourinary: Positive for vaginal bleeding. Negative for pelvic pain, vaginal discharge and vaginal pain.  Skin: Negative.   Psychiatric/Behavioral: Negative.        Objective:   Physical Exam  Constitutional: She is oriented to person, place, and time. She appears well-developed and well-nourished.  Abdominal: Soft. She exhibits no mass. There is no tenderness. There is no rebound and no guarding.  Genitourinary: Uterus normal. There is no rash, tenderness or lesion on the right labia. There is no rash, tenderness or lesion on the left labia. Cervix exhibits friability. Cervix exhibits no motion tenderness and no discharge. Right adnexum displays no mass, no tenderness and no fullness. Left adnexum displays no mass, no tenderness and no fullness. No tenderness or bleeding in the vagina. Vaginal discharge found.   Genitourinary Comments: White slightly odorous discharge noted, specimens taken Cervix friable with Q tip touch, no polyps noted in cervical os  Lymphadenopathy:       Right: No inguinal adenopathy present.       Left: No inguinal adenopathy present.  Neurological: She is alert and oriented to person, place, and time.  Skin: Skin is warm and dry.  Psychiatric: She has a normal mood and affect. Her behavior is normal. Judgment and thought content normal.       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 Discussed friable cervix with touching which can come from infection, normal for her with active sexual activity, or due to continuous use OCP and during what would be menses week. Questions addressed. Patient will stop OCP and have period and not restart to see if the spotting stops and will advise. Warning signs with bleeding discussed and need to advise if occurs. Labs: Affirm, GC,Chlamydia  Rv prn

## 2016-11-27 LAB — VAGINITIS/VAGINOSIS, DNA PROBE
CANDIDA SPECIES: NEGATIVE
GARDNERELLA VAGINALIS: POSITIVE — AB
TRICHOMONAS VAG: NEGATIVE

## 2016-11-29 LAB — GC/CHLAMYDIA PROBE AMP
CHLAMYDIA, DNA PROBE: NEGATIVE
Neisseria gonorrhoeae by PCR: NEGATIVE

## 2016-11-30 ENCOUNTER — Other Ambulatory Visit: Payer: Self-pay

## 2016-11-30 MED ORDER — METRONIDAZOLE 0.75 % VA GEL: VAGINAL | 0 refills | Status: DC

## 2017-02-15 ENCOUNTER — Other Ambulatory Visit: Payer: Self-pay | Admitting: Nurse Practitioner

## 2017-02-15 DIAGNOSIS — Z1231 Encounter for screening mammogram for malignant neoplasm of breast: Secondary | ICD-10-CM

## 2017-04-01 ENCOUNTER — Ambulatory Visit
Admission: RE | Admit: 2017-04-01 | Discharge: 2017-04-01 | Disposition: A | Discharge status: Home / Self Care | Payer: PRIVATE HEALTH INSURANCE | Source: Ambulatory Visit | Attending: Nurse Practitioner | Admitting: Nurse Practitioner

## 2017-04-01 DIAGNOSIS — Z1231 Encounter for screening mammogram for malignant neoplasm of breast: Secondary | ICD-10-CM

## 2017-06-24 ENCOUNTER — Ambulatory Visit: Payer: No Typology Code available for payment source | Admitting: Certified Nurse Midwife

## 2017-06-24 ENCOUNTER — Ambulatory Visit: Payer: No Typology Code available for payment source | Admitting: Nurse Practitioner

## 2017-07-29 ENCOUNTER — Ambulatory Visit: Payer: No Typology Code available for payment source | Admitting: Certified Nurse Midwife

## 2017-08-19 ENCOUNTER — Ambulatory Visit: Payer: No Typology Code available for payment source | Admitting: Certified Nurse Midwife

## 2018-05-02 ENCOUNTER — Other Ambulatory Visit: Payer: Self-pay | Admitting: Certified Nurse Midwife

## 2018-05-02 DIAGNOSIS — Z1231 Encounter for screening mammogram for malignant neoplasm of breast: Secondary | ICD-10-CM

## 2018-05-16 ENCOUNTER — Ambulatory Visit: Payer: Self-pay | Admitting: Family Medicine

## 2018-05-16 VITALS — BP 100/75 | HR 97 | Temp 98.7°F | Resp 16 | Wt 168.4 lb

## 2018-05-16 DIAGNOSIS — R6889 Other general symptoms and signs: Secondary | ICD-10-CM

## 2018-05-16 DIAGNOSIS — R0981 Nasal congestion: Secondary | ICD-10-CM

## 2018-05-16 LAB — POCT INFLUENZA A/B
Influenza A, POC: NEGATIVE
Influenza B, POC: NEGATIVE

## 2018-05-16 MED ORDER — AZELASTINE HCL 0.1 % NA SOLN: 1.0000 | Freq: Two times a day (BID) | NASAL | 0 refills | Status: DC

## 2018-05-16 NOTE — Progress Notes (Signed)
Patient states she is doing better.

## 2018-05-16 NOTE — Progress Notes (Signed)
Lauren Stevens is a 48 y.o. female who presents today with 2 days of nasal congestion, mild body aches and new onset chills. She has taken over the counter antipyretic with only mild relief of symptoms. She feels she may have been exposed and wanted to have testing for influenza. She reports taht she is in school right now so some risk from exposure to other students but reports not household contacts with symptoms.        Review of Systems  Constitutional: Positive for chills and malaise/fatigue. Negative for fever.  HENT: Positive for congestion. Negative for ear discharge, ear pain, hearing loss, sinus pain, sore throat and tinnitus.   Eyes: Negative.  Negative for redness.  Respiratory: Negative for cough, sputum production and shortness of breath.   Cardiovascular: Negative.  Negative for chest pain.  Gastrointestinal: Negative for abdominal pain, diarrhea, nausea and vomiting.  Genitourinary: Negative for dysuria, frequency, hematuria and urgency.  Musculoskeletal: Negative for myalgias.  Skin: Negative.  Negative for itching and rash.  Neurological: Negative for headaches.  Endo/Heme/Allergies: Negative.   Psychiatric/Behavioral: Negative.     Lauren Stevens has a current medication list which includes the following prescription(s): biotin, metronidazole, multivitamin, azelastine, norethin ace-eth estrad-fe, and valacyclovir. Also is allergic to penicillins; other; and tetracyclines & related.  Lauren Stevens  has a past medical history of STD (sexually transmitted disease). Also  has no past surgical history on file.    O: Vitals:   05/16/18 1823  BP: 100/75  Pulse: 97  Resp: 16  Temp: 98.7 F (37.1 C)  SpO2: 95%     Physical Exam Vitals signs (afebrile) reviewed.  Constitutional:      General: She is not in acute distress.    Appearance: Normal appearance. She is well-developed. She is not ill-appearing, toxic-appearing or diaphoretic.  HENT:     Head: Normocephalic.     Right  Ear: Hearing, tympanic membrane, ear canal and external ear normal.     Left Ear: Hearing, tympanic membrane, ear canal and external ear normal.     Nose: Rhinorrhea present. No congestion. Rhinorrhea is clear.     Right Sinus: No maxillary sinus tenderness or frontal sinus tenderness.     Left Sinus: No maxillary sinus tenderness or frontal sinus tenderness.     Mouth/Throat:     Lips: Pink.     Mouth: Mucous membranes are moist.     Pharynx: Oropharynx is clear. Uvula midline. No pharyngeal swelling, oropharyngeal exudate, posterior oropharyngeal erythema or uvula swelling.     Tonsils: No tonsillar exudate or tonsillar abscesses.  Eyes:     Conjunctiva/sclera: Conjunctivae normal.     Pupils: Pupils are equal, round, and reactive to light.  Neck:     Musculoskeletal: Normal range of motion and neck supple.  Cardiovascular:     Rate and Rhythm: Normal rate and regular rhythm.     Pulses: Normal pulses.     Heart sounds: Normal heart sounds.  Pulmonary:     Effort: Pulmonary effort is normal.     Breath sounds: Normal breath sounds. No transmitted upper airway sounds. No decreased breath sounds, wheezing, rhonchi or rales.  Abdominal:     General: Bowel sounds are normal.     Palpations: Abdomen is soft.  Musculoskeletal: Normal range of motion.  Lymphadenopathy:     Head:     Right side of head: No submental, submandibular or tonsillar adenopathy.     Left side of head: No submental, submandibular or tonsillar adenopathy.  Cervical: Cervical adenopathy present.     Right cervical: Superficial cervical adenopathy present. No deep or posterior cervical adenopathy.    Left cervical: Superficial cervical adenopathy present. No deep or posterior cervical adenopathy.  Skin:    General: Skin is warm.  Neurological:     Mental Status: She is alert and oriented to person, place, and time.  Psychiatric:        Mood and Affect: Mood normal.        Behavior: Behavior is cooperative.     A: 1. Flu-like symptoms   2. Nasal congestion    P: Mild symptoms- advised patient to continue to monitor for worsening and given supportive medications for symptoms. Advised the benefits of antipyretic and good handwashing. No acute concerns related to exam today- suspect viral that will resolve with supportive care.  1. Flu-like symptoms - POCT Influenza A/B Recent Results (from the past 2160 hour(s))  POCT Influenza A/B     Status: Normal   Collection Time: 05/16/18  6:46 PM  Result Value Ref Range   Influenza A, POC Negative Negative   Influenza B, POC Negative Negative    2. Nasal congestion - azelastine (ASTELIN) 0.1 % nasal spray; Place 1 spray into both nostrils 2 (two) times daily. Use in each nostril as directed  Other orders - Biotin 1000 MCG tablet; Take 1,000 mcg by mouth 3 (three) times daily. - Multiple Vitamin (MULTIVITAMIN) tablet; Take 1 tablet by mouth daily.   Discussed with patient exam findings, suspected diagnosis etiology and  reviewed recommended treatment plan and follow up, including complications and indications for urgent medical follow up and evaluation. Medications including use and indications reviewed with patient. Patient provided relevant patient education on diagnosis and/or relevant related condition that were discussed and reviewed with patient at discharge. Patient verbalized understanding of information provided and agrees with plan of care (POC), all questions answered.

## 2018-05-16 NOTE — Patient Instructions (Signed)
Nonallergic Rhinitis Nonallergic rhinitis is a condition that causes symptoms that affect the nose, such as a runny nose and a stuffed-up nose (nasal congestion) that can make it hard to breathe through the nose. This condition is different from having an allergy (allergic rhinitis). Allergic rhinitis occurs when the body's defense system (immune system) reacts to a substance that you are allergic to (allergen), such as pollen, pet dander, mold, or dust. Nonallergic rhinitis has many similar symptoms, but it is not caused by allergens. Nonallergic rhinitis can be a short-term or long-term problem. What are the causes? This condition can be caused by many different things. Some common types of nonallergic rhinitis include: Infectious rhinitis  This is usually due to an infection in the upper respiratory tract. Vasomotor rhinitis  This is the most common type of long-term nonallergic rhinitis.  It is caused by too much blood flow through the nose, which makes the tissue inside of the nose swell.  Symptoms are often triggered by strong odors, cold air, stress, drinking alcohol, cigarette smoke, or changes in the weather. Occupational rhinitis  This type is caused by triggers in the workplace, such as chemicals, dusts, animal dander, or air pollution. Hormonal rhinitis  This type occurs in women as a result of an increase in the female hormone estrogen.  It may occur during pregnancy, puberty, and menstrual cycles.  Symptoms improve when estrogen levels drop. Drug-induced rhinitis Several drugs can cause nonallergic rhinitis, including:  Medicines that are used to treat high blood pressure, heart disease, and Parkinson disease.  Aspirin and NSAIDs.  Over-the-counter nasal decongestant sprays. These can cause a type of nonallergic rhinitis (rhinitis medicamentosa) when they are used for more than a few days. Nonallergic rhinitis with eosinophilia syndrome (NARES)  This type is caused by  having too much of a certain type of white blood cell (eosinophil). Nonallergic rhinitis can also be caused by a reaction to eating hot or spicy foods. This does not usually cause long-term symptoms. In some cases, the cause of nonallergic rhinitis is not known. What increases the risk? You are more likely to develop this condition if:  You are 30-60 years of age.  You are a woman. Women are twice as likely to have this condition. What are the signs or symptoms? Common symptoms of this condition include:  Nasal congestion.  Runny nose.  The feeling of mucus going down the back of the throat (postnasal drip).  Trouble sleeping at night and daytime sleepiness. Less common symptoms include:  Sneezing.  Coughing.  Itchy nose.  Bloodshot eyes. How is this diagnosed? This condition may be diagnosed based on:  Your symptoms and medical history.  A physical exam.  Allergy testing to rule out allergic rhinitis. You may have skin tests or blood tests. In some cases, the health care provider may take a swab of nasal secretions to look for an increased number of eosinophils. This would be done to confirm a diagnosis of NARES. How is this treated? Treatment for this condition depends on the cause. No single treatment works for everyone. Work with your health care provider to find the best treatment for you. Treatment may include:  Avoiding the things that trigger your symptoms.  Using medicines to relieve congestion, such as: ? Steroid nasal spray. There are many types. You may need to try a few to find out which one works best. ? Decongestant medicine. This may be an oral medicine or a nasal spray. These medicines are only used for   a short time.  Using medicines to relieve a runny nose. These may include antihistamine medicines or anticholinergic nasal sprays.  Surgery to remove tissue from inside the nose may be needed in severe cases if the condition has not improved after 6-12  months of medical treatment. Follow these instructions at home:  Take or use over-the-counter and prescription medicines only as told by your health care provider. Do not stop using your medicine even if you start to feel better.  Use salt-water (saline) rinses or other solutions (nasal washes or irrigations) to wash or rinse out the inside of your nose as told by your health care provider.  Do not take NSAIDs or medicines that contain aspirin if they make your symptoms worse.  Do not drink alcohol if it makes your symptoms worse.  Do not use any tobacco products, such as cigarettes, chewing tobacco, and e-cigarettes. If you need help quitting, ask your health care provider.  Avoid secondhand smoke.  Get some exercise every day. Exercise may help reduce symptoms of nonallergic rhinitis for some people. Ask your health care provider how much exercise and what types of exercise are safe for you.  Sleep with the head of your bed raised (elevated). This may reduce nighttime nasal congestion.  Keep all follow-up visits as told by your health care provider. This is important. Contact a health care provider if:  You have a fever.  Your symptoms are getting worse at home.  Your symptoms are not responding to medicine.  You develop new symptoms, especially a headache or nosebleed. This information is not intended to replace advice given to you by your health care provider. Make sure you discuss any questions you have with your health care provider. Document Released: 07/14/2015 Document Revised: 08/28/2015 Document Reviewed: 06/12/2015 Elsevier Interactive Patient Education  2019 [employer]  

## 2018-05-18 ENCOUNTER — Telehealth: Payer: Self-pay

## 2018-05-18 NOTE — Telephone Encounter (Signed)
Patient states she is doing better.

## 2018-06-01 ENCOUNTER — Ambulatory Visit: Payer: PRIVATE HEALTH INSURANCE

## 2018-06-29 ENCOUNTER — Ambulatory Visit: Payer: PRIVATE HEALTH INSURANCE

## 2018-09-01 ENCOUNTER — Other Ambulatory Visit: Payer: Self-pay

## 2018-09-01 ENCOUNTER — Ambulatory Visit
Admission: RE | Admit: 2018-09-01 | Discharge: 2018-09-01 | Disposition: A | Discharge status: Home / Self Care | Payer: BLUE CROSS/BLUE SHIELD | Source: Ambulatory Visit | Attending: Certified Nurse Midwife | Admitting: Certified Nurse Midwife

## 2018-09-01 DIAGNOSIS — Z1231 Encounter for screening mammogram for malignant neoplasm of breast: Secondary | ICD-10-CM

## 2018-10-09 DIAGNOSIS — L820 Inflamed seborrheic keratosis: Secondary | ICD-10-CM | POA: Diagnosis not present

## 2018-10-09 DIAGNOSIS — D229 Melanocytic nevi, unspecified: Secondary | ICD-10-CM | POA: Diagnosis not present

## 2018-10-17 DIAGNOSIS — S61259A Open bite of unspecified finger without damage to nail, initial encounter: Secondary | ICD-10-CM | POA: Diagnosis not present

## 2018-10-17 DIAGNOSIS — W540XXA Bitten by dog, initial encounter: Secondary | ICD-10-CM | POA: Diagnosis not present

## 2018-11-21 ENCOUNTER — Other Ambulatory Visit (HOSPITAL_COMMUNITY)
Admission: RE | Admit: 2018-11-21 | Discharge: 2018-11-21 | Disposition: A | Discharge status: Home / Self Care | Payer: BC Managed Care – PPO | Source: Ambulatory Visit | Attending: Nurse Practitioner | Admitting: Nurse Practitioner

## 2018-11-21 ENCOUNTER — Other Ambulatory Visit: Payer: Self-pay | Admitting: Nurse Practitioner

## 2018-11-21 DIAGNOSIS — Z87898 Personal history of other specified conditions: Secondary | ICD-10-CM | POA: Diagnosis not present

## 2018-11-21 DIAGNOSIS — Z01419 Encounter for gynecological examination (general) (routine) without abnormal findings: Secondary | ICD-10-CM | POA: Insufficient documentation

## 2018-11-21 DIAGNOSIS — Z113 Encounter for screening for infections with a predominantly sexual mode of transmission: Secondary | ICD-10-CM | POA: Diagnosis not present

## 2018-11-23 LAB — CYTOLOGY - PAP
Chlamydia: NEGATIVE
Diagnosis: NEGATIVE
HPV: NOT DETECTED
Neisseria Gonorrhea: NEGATIVE

## 2019-06-25 ENCOUNTER — Encounter: Payer: Self-pay | Admitting: Certified Nurse Midwife

## 2019-08-07 ENCOUNTER — Other Ambulatory Visit: Payer: Self-pay | Admitting: Nurse Practitioner

## 2019-08-07 DIAGNOSIS — Z1231 Encounter for screening mammogram for malignant neoplasm of breast: Secondary | ICD-10-CM

## 2019-09-04 ENCOUNTER — Ambulatory Visit: Payer: PRIVATE HEALTH INSURANCE

## 2019-09-13 ENCOUNTER — Ambulatory Visit
Admission: RE | Admit: 2019-09-13 | Discharge: 2019-09-13 | Disposition: A | Discharge status: Home / Self Care | Payer: BC Managed Care – PPO | Source: Ambulatory Visit | Attending: Nurse Practitioner | Admitting: Nurse Practitioner

## 2019-09-13 ENCOUNTER — Other Ambulatory Visit: Payer: Self-pay

## 2019-09-13 DIAGNOSIS — Z1231 Encounter for screening mammogram for malignant neoplasm of breast: Secondary | ICD-10-CM | POA: Diagnosis not present

## 2019-09-20 DIAGNOSIS — H53143 Visual discomfort, bilateral: Secondary | ICD-10-CM | POA: Diagnosis not present

## 2019-11-22 DIAGNOSIS — R32 Unspecified urinary incontinence: Secondary | ICD-10-CM | POA: Diagnosis not present

## 2019-11-22 DIAGNOSIS — N898 Other specified noninflammatory disorders of vagina: Secondary | ICD-10-CM | POA: Diagnosis not present

## 2019-11-22 DIAGNOSIS — Z01419 Encounter for gynecological examination (general) (routine) without abnormal findings: Secondary | ICD-10-CM | POA: Diagnosis not present

## 2019-12-13 DIAGNOSIS — M25562 Pain in left knee: Secondary | ICD-10-CM | POA: Diagnosis not present

## 2019-12-13 DIAGNOSIS — M25561 Pain in right knee: Secondary | ICD-10-CM | POA: Diagnosis not present

## 2019-12-19 DIAGNOSIS — Z789 Other specified health status: Secondary | ICD-10-CM | POA: Diagnosis not present

## 2019-12-19 DIAGNOSIS — Z23 Encounter for immunization: Secondary | ICD-10-CM | POA: Diagnosis not present

## 2020-09-02 ENCOUNTER — Other Ambulatory Visit: Payer: Self-pay | Admitting: Nurse Practitioner

## 2020-09-25 ENCOUNTER — Other Ambulatory Visit: Payer: Self-pay | Admitting: Nurse Practitioner

## 2020-09-25 DIAGNOSIS — Z1231 Encounter for screening mammogram for malignant neoplasm of breast: Secondary | ICD-10-CM

## 2020-10-02 DIAGNOSIS — Z1231 Encounter for screening mammogram for malignant neoplasm of breast: Secondary | ICD-10-CM

## 2020-10-24 ENCOUNTER — Other Ambulatory Visit: Payer: Self-pay | Admitting: Obstetrics & Gynecology

## 2020-10-24 DIAGNOSIS — Z1231 Encounter for screening mammogram for malignant neoplasm of breast: Secondary | ICD-10-CM

## 2020-11-01 ENCOUNTER — Ambulatory Visit
Admission: RE | Admit: 2020-11-01 | Discharge: 2020-11-01 | Disposition: A | Discharge status: Home / Self Care | Payer: BC Managed Care – PPO | Source: Ambulatory Visit

## 2020-11-01 DIAGNOSIS — Z1231 Encounter for screening mammogram for malignant neoplasm of breast: Secondary | ICD-10-CM

## 2020-12-02 DIAGNOSIS — L719 Rosacea, unspecified: Secondary | ICD-10-CM | POA: Diagnosis not present

## 2021-01-19 ENCOUNTER — Other Ambulatory Visit (HOSPITAL_COMMUNITY)
Admission: RE | Admit: 2021-01-19 | Discharge: 2021-01-19 | Disposition: A | Discharge status: Home / Self Care | Payer: BC Managed Care – PPO | Source: Ambulatory Visit | Attending: Obstetrics & Gynecology | Admitting: Obstetrics & Gynecology

## 2021-01-19 ENCOUNTER — Encounter (HOSPITAL_BASED_OUTPATIENT_CLINIC_OR_DEPARTMENT_OTHER): Payer: Self-pay | Admitting: Obstetrics & Gynecology

## 2021-01-19 ENCOUNTER — Ambulatory Visit (INDEPENDENT_AMBULATORY_CARE_PROVIDER_SITE_OTHER): Payer: BC Managed Care – PPO | Admitting: Obstetrics & Gynecology

## 2021-01-19 ENCOUNTER — Other Ambulatory Visit: Payer: Self-pay

## 2021-01-19 VITALS — BP 120/80 | HR 71 | Ht 66.0 in | Wt 181.2 lb

## 2021-01-19 DIAGNOSIS — Z0000 Encounter for general adult medical examination without abnormal findings: Secondary | ICD-10-CM

## 2021-01-19 DIAGNOSIS — N393 Stress incontinence (female) (male): Secondary | ICD-10-CM | POA: Diagnosis not present

## 2021-01-19 DIAGNOSIS — Z124 Encounter for screening for malignant neoplasm of cervix: Secondary | ICD-10-CM | POA: Diagnosis not present

## 2021-01-19 DIAGNOSIS — N930 Postcoital and contact bleeding: Secondary | ICD-10-CM | POA: Insufficient documentation

## 2021-01-19 DIAGNOSIS — Z1159 Encounter for screening for other viral diseases: Secondary | ICD-10-CM

## 2021-01-19 DIAGNOSIS — Z01419 Encounter for gynecological examination (general) (routine) without abnormal findings: Secondary | ICD-10-CM

## 2021-01-19 DIAGNOSIS — Z1211 Encounter for screening for malignant neoplasm of colon: Secondary | ICD-10-CM

## 2021-01-19 NOTE — Progress Notes (Addendum)
50 y.o. [id number] Single White or Caucasian female here for annual exam/new patient exam.  Cycles are not very regular at this point.  Reports she did have a cycle in September and October.  Flow is short and light.  Did have an FSH done last year.  She cannot remember results.    Together with partner for two years.  She has noticed some vaginal dryness.    Having some increased leaking of urine.  She is noticing this with sneezing, walking/exercise.  She drinks coffee in the morning.  She has cut back on caffeine in the past and this has helped.    No LMP recorded. (Menstrual status: Oral contraceptives).          Sexually active: Yes.    The current method of family planning is none.    Exercising: no Smoker:  no  Health Maintenance: Pap:  2020 neg with neg HR HPV 11/2018 History of abnormal Pap:  no MMG:  11/2020 Colonoscopy:  referral to [facility] per pt request Screening Labs: discussed today   reports that she has never smoked. She has never used smokeless tobacco. She reports current alcohol use of about 5.0 - 6.0 standard drinks per week. She reports that she does not use drugs.  Past Medical History:  Diagnosis Date   STD (sexually transmitted disease)    HSV 2    Past Surgical History:  Procedure Laterality Date   BREAST BIOPSY Right 08/15/2014   adenosis/ PASH    Current Outpatient Medications  Medication Sig Dispense Refill   doxycycline (PERIOSTAT) 20 MG tablet Take 20 mg by mouth 2 (two) times daily. As needed     valACYclovir (VALTREX) 500 MG tablet Take 1 tablet (500 mg total) by mouth 2 (two) times daily. 90 tablet 4   azelastine (ASTELIN) 0.1 % nasal spray Place 1 spray into both nostrils 2 (two) times daily. Use in each nostril as directed (Patient not taking: Reported on 01/19/2021) 30 mL 0   Biotin 1000 MCG tablet Take 1,000 mcg by mouth 3 (three) times daily. (Patient not taking: Reported on 01/19/2021)     metroNIDAZOLE (METROGEL) 0.75 % vaginal gel Insert 1  applicatorful vaginally twice daily for 5 days. (Patient not taking: Reported on 01/19/2021) 70 g 0   Multiple Vitamin (MULTIVITAMIN) tablet Take 1 tablet by mouth daily. (Patient not taking: Reported on 01/19/2021)     Norethin Ace-Eth Estrad-FE 1-20 MG-MCG(24) CAPS Take 1 capsule by mouth daily. (Patient not taking: Reported on 01/19/2021)     No current facility-administered medications for this visit.    Family History  Problem Relation Age of Onset   Diabetes Mother    Hypertension Mother    Hyperlipidemia Mother    Kidney cancer Mother        ? removed right kidney   Heart failure Father    Diabetes Father    Rheum arthritis Brother    Heart attack Maternal Grandfather     Review of Systems  Genitourinary:  Positive for vaginal bleeding.   Exam:   BP 120/80 (BP Location: Right Arm, Patient Position: Sitting, Cuff Size: Large)   Pulse 71   Ht 5\' 6"  (1.676 m)   Wt 181 lb 3.2 oz (82.2 kg)   BMI 29.25 kg/m   Height: 5\' 6"  (167.6 cm)  General appearance: alert, cooperative and appears stated age Head: Normocephalic, without obvious abnormality, atraumatic Neck: no adenopathy, supple, symmetrical, trachea midline and thyroid normal to inspection and palpation  Lungs: clear to auscultation bilaterally Breasts: normal appearance, no masses or tenderness Heart: regular rate and rhythm Abdomen: soft, non-tender; bowel sounds normal; no masses,  no organomegaly Extremities: extremities normal, atraumatic, no cyanosis or edema Skin: Skin color, texture, turgor normal. No rashes or lesions Lymph nodes: Cervical, supraclavicular, and axillary nodes normal. No abnormal inguinal nodes palpated Neurologic: Grossly normal   Pelvic: External genitalia:  no lesions              Urethra:  normal appearing urethra with no masses, tenderness or lesions              Bartholins and Skenes: normal                 Vagina: normal appearing vagina with normal color and no discharge, no  lesions              Cervix: no lesions              Pap taken: Yes.   Bimanual Exam:  Uterus:  normal size, contour, position, consistency, mobility, non-tender              Adnexa: normal adnexa and no mass, fullness, tenderness               Rectovaginal: Confirms               Anus:  normal sphincter tone, no lesions  Chaperone, Ina Homes, CMA, was present for exam.  Assessment/Plan: 1. Well woman exam with routine gynecological exam - neg pap with neg HPV 05/2018 - MMG up to date - colonoscopy referral placed - care gaps reviewed/updated  2. Cervical cancer screening/Post coital spotting - Cytology - PAP( [employer])  3. PCB (post coital bleeding) - Follicle stimulating hormone; Future  4. SUI (stress urinary incontinence, female - Ambulatory referral to Physical Therapy  5. Colon cancer screening - Ambulatory referral to Gastroenterology  6. Blood tests for routine general physical examination - CBC; Future - Comprehensive metabolic panel; Future - TSH; Future - VITAMIN D 25 Hydroxy (Vit-D Deficiency, Fractures); Future - Lipid panel; Future

## 2021-01-21 ENCOUNTER — Other Ambulatory Visit (HOSPITAL_BASED_OUTPATIENT_CLINIC_OR_DEPARTMENT_OTHER): Payer: Self-pay

## 2021-01-21 ENCOUNTER — Other Ambulatory Visit (HOSPITAL_BASED_OUTPATIENT_CLINIC_OR_DEPARTMENT_OTHER): Payer: BC Managed Care – PPO

## 2021-01-21 ENCOUNTER — Other Ambulatory Visit: Payer: Self-pay

## 2021-01-21 DIAGNOSIS — N930 Postcoital and contact bleeding: Secondary | ICD-10-CM

## 2021-01-21 DIAGNOSIS — Z0000 Encounter for general adult medical examination without abnormal findings: Secondary | ICD-10-CM | POA: Diagnosis not present

## 2021-01-21 DIAGNOSIS — Z1159 Encounter for screening for other viral diseases: Secondary | ICD-10-CM | POA: Diagnosis not present

## 2021-01-21 NOTE — Progress Notes (Signed)
Pt here for lab work 

## 2021-01-22 ENCOUNTER — Other Ambulatory Visit (HOSPITAL_BASED_OUTPATIENT_CLINIC_OR_DEPARTMENT_OTHER): Payer: BC Managed Care – PPO

## 2021-01-22 LAB — CYTOLOGY - PAP: Diagnosis: NEGATIVE

## 2021-01-26 LAB — COMPREHENSIVE METABOLIC PANEL
ALT: 13 IU/L (ref 0–32)
AST: 19 IU/L (ref 0–40)
Albumin/Globulin Ratio: 2 (ref 1.2–2.2)
Albumin: 4.5 g/dL (ref 3.8–4.8)
Alkaline Phosphatase: 56 IU/L (ref 44–121)
BUN/Creatinine Ratio: 23 (ref 9–23)
BUN: 19 mg/dL (ref 6–24)
Bilirubin Total: 0.6 mg/dL (ref 0.0–1.2)
CO2: 18 mmol/L — ABNORMAL LOW (ref 20–29)
Calcium: 9.6 mg/dL (ref 8.7–10.2)
Chloride: 109 mmol/L — ABNORMAL HIGH (ref 96–106)
Creatinine, Ser: 0.84 mg/dL (ref 0.57–1.00)
Globulin, Total: 2.2 g/dL (ref 1.5–4.5)
Glucose: 81 mg/dL (ref 70–99)
Potassium: 4.8 mmol/L (ref 3.5–5.2)
Sodium: 147 mmol/L — ABNORMAL HIGH (ref 134–144)
Total Protein: 6.7 g/dL (ref 6.0–8.5)
eGFR: 85 mL/min/{1.73_m2} (ref >59)

## 2021-01-26 LAB — CBC
Hematocrit: 42.6 % (ref 34.0–46.6)
Hemoglobin: 14.8 g/dL (ref 11.1–15.9)
MCH: 30.8 pg (ref 26.6–33.0)
MCHC: 34.7 g/dL (ref 31.5–35.7)
MCV: 89 fL (ref 79–97)
Platelets: 184 10*3/uL (ref 150–450)
RBC: 4.8 x10E6/uL (ref 3.77–5.28)
RDW: 11.9 % (ref 11.7–15.4)
WBC: 4.2 10*3/uL (ref 3.4–10.8)

## 2021-01-26 LAB — LIPID PANEL
Chol/HDL Ratio: 2.4 ratio (ref 0.0–4.4)
Cholesterol, Total: 193 mg/dL (ref 100–199)
HDL: 81 mg/dL (ref >39)
LDL Chol Calc (NIH): 101 mg/dL — ABNORMAL HIGH (ref 0–99)
Triglycerides: 59 mg/dL (ref 0–149)
VLDL Cholesterol Cal: 11 mg/dL (ref 5–40)

## 2021-01-26 LAB — FOLLICLE STIMULATING HORMONE: FSH: 56.4 m[IU]/mL

## 2021-01-26 LAB — VITAMIN D 25 HYDROXY (VIT D DEFICIENCY, FRACTURES)

## 2021-01-26 LAB — TSH: TSH: 1.69 u[IU]/mL (ref 0.450–4.500)

## 2021-01-26 LAB — HEPATITIS C ANTIBODY: Hep C Virus Ab: <0.1 s/co ratio (ref 0.0–0.9)

## 2021-02-13 ENCOUNTER — Ambulatory Visit: Payer: BC Managed Care – PPO | Admitting: Physical Therapy

## 2021-03-19 ENCOUNTER — Ambulatory Visit: Payer: BC Managed Care – PPO | Admitting: Physical Therapy

## 2021-04-17 ENCOUNTER — Ambulatory Visit: Payer: BC Managed Care – PPO | Attending: Obstetrics & Gynecology | Admitting: Physical Therapy

## 2021-04-17 ENCOUNTER — Other Ambulatory Visit: Payer: Self-pay

## 2021-04-17 ENCOUNTER — Encounter: Payer: Self-pay | Admitting: Physical Therapy

## 2021-04-17 DIAGNOSIS — M6281 Muscle weakness (generalized): Secondary | ICD-10-CM | POA: Diagnosis not present

## 2021-04-17 DIAGNOSIS — N393 Stress incontinence (female) (male): Secondary | ICD-10-CM | POA: Diagnosis not present

## 2021-04-17 DIAGNOSIS — R278 Other lack of coordination: Secondary | ICD-10-CM | POA: Insufficient documentation

## 2021-04-17 NOTE — Patient Instructions (Signed)
About Cystocele Overview The pelvic organs, including the bladder, are normally supported by pelvic floor muscles and ligaments.   When these muscles and ligaments are stretched, weakened or torn, the wall between the bladder and the vagina sags or herniates causing a prolapse, sometimes called a cystocele. This condition may cause discomfort and problems with emptying the bladder.  It can be present in various stages.  Some people are not aware of the changes. Others may notice changes at the vaginal opening or a feeling of the bladder dropping outside the body. Causes of a Cystocele A cystocele is usually caused by muscle straining or stretching during childbirth.  In addition, cystocele is more common after menopause, because the hormone estrogen helps keep the elastic tissues around the pelvic organs strong.  A cystocele is more likely to occur when levels of estrogen decrease. Other causes include: heavy lifting, chronic coughing, previous pelvic surgery and obesity.  Symptoms A bladder that has dropped from its normal position may cause: unwanted urine leakage (stress incontinence), frequent urination or urge to urinate, incomplete emptying of the bladder (not feeling bladder relief after emptying), pain or discomfort in the vagina, pelvis, groin, lower back or lower abdomen and frequent urinary tract infections.  Mild cases may not cause any symptoms.  Treatment Options Pelvic floor (Kegel) exercises: Strength training the muscles in your genital area  Behavioral changes: Treating and preventing constipation, taking time to empty your bladder properly, learning to lift properly and/or  avoid heavy lifting when possible, stopping smoking, avoiding weight gain and treating a chronic cough or bronchitis. A pessary: A vaginal support device is sometimes used to help pelvic support caused by muscle and ligament changes. Surgery: Aurgical repair may be necessary if symptoms cannot be managed with  exercise, behavioral changes and a pessary. Surgery is usually considered for severe cases.    About Pelvic Support Problems Pelvic Support Problems Explained Ligaments, muscles, and connective tissue normally hold your bladder, uterus, and other organs in their proper places in your pelvis. When these tissues become weak, a problem with pelvic support may result. Weak support can cause one or more of the pelvic organs to drop down into the vagina. An organ may even drop so far that is partially exposed outside the body.  Pelvic support problems are named by the change in the organ. The main types of pelvic support problems are:  Cystocele: When the bladder drops down into your vagina.  Enterocele: When your small intestine drops between your vagina and rectum.  Rectocele: When your rectum bulges into the vaginal wall.  Uterine prolapse: When your uterus drops into your vagina.  Vaginal prolapse: When the top part of the vagina begins to droop. This sometimes happens after a hysterectomy (removal of the uterus).  Causes Pelvic support problems can be caused by many conditions. They may begin after you give birth, especially if you had a large baby. During childbirth, the muscles and skin of the birth canal (vagina) are stretched and sometimes torn. They heal over time but are not always exactly the same. A long pushing stage of labor may also weaken these tissues as well as very rapid births as the tissues do not have time to stretch so they tear.  Also, after menopause, there are changes in the vaginal walls resulting from a decrease in estrogen. Estrogen helps to keep the tissues toned. Low levels of estrogen weaken the vaginal walls and may cause the bladder to shift from its normal position. As  women get older, the loss of muscle tone and the relaxation of muscles may cause the uterus or other organs to drop.  Over time, conditions like chronic coughing, chronic constipation, doing a lot of heavy  lifting, straining to pass stool, and obesity, can also weaken the pelvic support muscles.  Diagnosing Pelvic Support Problems Your health care provider will ask about your symptoms and do a pelvic examination. Your provider may also do a rectal exam during your pelvic exam. Your provider may ask you to: 1. Bear down and push (like you are having a bowel movement) so he or she can see if your bladder or other part of your body protrudes into the vagina. 2. Contract the muscles of your pelvis to check the strength of your pelvic muscles.  3. Do several types of urine, nerve and muscle tests of the pelvis and around the bladder to see what type of treatment is best for you.   Symptoms Symptoms of pelvic support problems depend on the organ involved, but may include:  urine leakage  stain or fecal loss after a bowel movement trouble having bowel movements  ache in the lower abdomen, groin, or lower back  bladder infection  a feeling of heaviness, pulling, or fullness in the pelvis, or a feeling that something is falling out of the vagina  an organ protruding from your vaginal opening  feeling the need to support the organs or perineal area to empty bladder or bowels painful sexual intercourse.  Many women feel pelvic pressure or trouble holding their urine immediately after childbirth. For some, these symptoms go away permanently, in others they return as they get older.  Treatment Options A prolapsed organ cannot repair itself. Contact your health care provider as soon as you notice symptoms of a problem. Treatment depends on what the specific problem is and how far advanced it is.  The symptoms caused by some pelvic support problems may simply be treated with changes in diet, medicine to soften the stool, weight loss, or avoiding strenuous activities. You may also do pelvic floor exercises to help strengthen your pelvic muscles.  Some cases of prolapse may require a special support device made  from plastic or rubber called a pessary that fits into the vagina to support the uterus, vagina, or bladder. A pessary can also help women who leak urine when coughing, straining, or exercising. In mild cases, a tampon or vaginal diaphragm may be used instead of a pessary.  Talk to your doctor or health care provider about these options. In serious cases, surgery may be needed to put the organs back into their proper place. The uterus may be removed because of the pressure it puts on the bladder.  Your doctor will know what surgery will be best for you. How can I prevent pelvic support problems?  You can help prevent pelvic support problems by:  maintaining a healthy lifestyle  continuing to do pelvic floor exercises after you deliver a baby  maintaining a healthy weight  avoiding a lot of heavy lifting and lifting with your legs (not from your waist)  treating constipation and avoid getting   Ways to protect prolapse Quitting smoking. Treating conditions that might put strain on the pelvic floor, such as a chronic cough or constipation. Losing weight. Strengthening your core and your pelvic floor. Avoiding heavy lifting, pushing, pulling, and bending When lifting using proper body mechanics Not straining during bowel movements. High fiber diet, 30 g, to reduce chance of constipation  Drink 6-8 glasses of water per day Avoid being on your feet for long periods of time Middle of the day lay on your back with your feet propped up for 5-10 minutes to   [facility] [address] [place], [place] [address] Phone # [phone] Fax [phone]  Try a tampon while working  to see if changes leakage  Lay on back with legs up on couch and pillow under hips and lay for 10 minutes.

## 2021-04-17 NOTE — Therapy (Signed)
[place] @ [place], [place], [address] Phone: [phone]   Fax:  [phone]  Physical Therapy Treatment  Patient Details  Name: Lauren Stevens MRN: [id number] Date of Birth: 1970/11/17 Referring Provider (PT): Dr. Hale Bogus   Encounter Date: 04/17/2021   PT End of Session - 04/17/21 0906     Visit Number 1    Date for PT Re-Evaluation 07/10/21    Authorization Type BCBS    Authorization - Visit Number 1    Authorization - Number of Visits 30    PT Start Time 0845    PT Stop Time [id number]    PT Time Calculation (min) 40 min    Activity Tolerance Patient tolerated treatment well;No increased pain    Behavior During Therapy WFL for tasks assessed/performed             Past Medical History:  Diagnosis Date   STD (sexually transmitted disease)    HSV 2    Past Surgical History:  Procedure Laterality Date   BREAST BIOPSY Right 08/15/2014   adenosis/ PASH    There were no vitals filed for this visit.   Subjective Assessment - 04/17/21 0853     Subjective Patient has urine leakage that is worse in the past year. 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 She is having more leakage randomly. More frequetly with being on her feet and walking.    Patient Stated Goals reduce urinary leakage    Currently in Pain? Yes    Pain Score 6     Pain Location Vagina    Pain Orientation Mid    Pain Descriptors / Indicators Contraction;Discomfort    Pain Type Acute pain    Pain Onset More than a month ago    Pain Frequency Intermittent    Aggravating Factors  deep penetrative penile penetration    Pain Relieving Factors no penile penetration    Multiple Pain Sites No                [facility] PT Assessment - 04/17/21 0001       Assessment   Medical Diagnosis N393.3 SUI    Referring Provider (PT) Dr. Hale Bogus    Onset Date/Surgical Date --   past few years   Prior Therapy none       Precautions   Precautions None      Restrictions   Weight Bearing Restrictions No      Balance Screen   Has the patient fallen in the past 6 months No    Has the patient had a decrease in activity level because of a fear of falling?  No    Is the patient reluctant to leave their home because of a fear of falling?  No      Home Ecologist residence      Prior Function   Level of Independence Independent    Leisure starting to use the [place]   Overall Cognitive Status Within Functional Limits for tasks assessed      Posture/Postural Control   Posture/Postural Control No significant limitations      ROM / Strength   AROM / PROM / Strength AROM;PROM;Strength      AROM   Overall AROM Comments full lumbar ROM      Strength   Right Hip Flexion 4/5    Right Hip Extension 4/5  Right Hip ABduction 4/5    Right Hip ADduction 4/5    Left Hip Flexion 4/5    Left Hip Extension 4/5    Left Hip ABduction 4/5    Left Hip ADduction 4/5                        Pelvic Floor Special Questions - 04/17/21 0001     Prior Pregnancies Yes    Number of Pregnancies 2    Number of Vaginal Deliveries 1    Currently Sexually Active Yes    Is this Painful Yes   sometimes, deep penetration, sometimes uses lubricants   Urinary Leakage Yes    Pad use does not wear pads    Activities that cause leaking Other    Other activities that cause leaking when on feet, constant leakage    Urinary urgency No    Fecal incontinence No    Caffeine beverages has tried to cut out caffiene and has noticed a little difference    Falling out feeling (prolapse) No    External Perineal Exam standing you can see the anterior wall at the introitus, supine you can see it when she bears down    Skin Integrity Intact    Prolapse Anterior Wall;Uterine    Pelvic Floor Internal Exam Patient confirms identification and approves PT to assess pelvic floor and  treatment    Exam Type Vaginal    Palpation tenderness located on the left iliococcygeus posteriorly, decreased mobility of the anterior portion of the cervix    Strength weak squeeze, no lift   weaker on the sides, no lift                       PT Education - 04/17/21 0928     Education Details gave patient informtion on cystocele and management of prolapse    Person(s) Educated Patient    Methods Explanation;Handout    Comprehension Verbalized understanding              PT Short Term Goals - 04/17/21 0934       PT SHORT TERM GOAL #1   Title independent with initial HEP for pelvic floor strength    Time 4    Period Weeks    Status New    Target Date 05/15/21      PT SHORT TERM GOAL #2   Title understand ways to manage prolapse with pressure management, correct body mechanics    Time 4    Period Weeks    Status New    Target Date 05/15/21               PT Long Term Goals - 04/17/21 0935       PT LONG TERM GOAL #1   Title independent with advanced HEP for core and pelvic floor strength to reduce leakage    Time 12    Period Weeks    Status New    Target Date 07/10/21      PT LONG TERM GOAL #2   Title urinary leakage in standing while she is working decreased >/= 50% due to improve strength and coordination of pelvic floor    Time 12    Period Weeks    Status New    Target Date 07/10/21      PT LONG TERM GOAL #3   Title increased bilateral hip and core strength so she is able to reduce her  prolapse 50% of the time and reduce her leakage    Time 12    Period Weeks    Status New    Target Date 07/10/21      PT LONG TERM GOAL #4   Title pain with intercourse is </= 0-1/10 due to reduction of trigger points in the pelvic floor    Time 12    Period Weeks    Status New    Target Date 07/10/21                   Plan - 04/17/21 0929     Clinical Impression Statement Patient is a 51 year old female with stress incontinence.  She will leak mostly in standing and feels it is constant. Patient pelvic floor strength is 2/5 with squeeze but no lift. She has decreased mobility of the anterior portion of the cervix. She has anterior wall weakness that will go to the introitus when she bears down in supine and is present instanding without bearing down. She has tenderness located at the left iliococcygeus posteriorly. Patient reports deep pain with penile penetration that will on occasion stop her from intercourse. Patient has weakness in her hips averaging 4/5. Patient will benefit from skilled therapy to improve pelvic floor strength and coordination to reduce her leakage and manage her prolapse.    Personal Factors and Comorbidities Fitness;Profession    Examination-Activity Limitations Continence;Stand    Stability/Clinical Decision Making Stable/Uncomplicated    Clinical Decision Making Low    Rehab Potential Excellent    PT Frequency 1x / week   every other week   PT Duration 12 weeks    PT Treatment/Interventions ADLs/Self Care Home Management;Biofeedback;Therapeutic activities;Therapeutic exercise;Neuromuscular re-education;Patient/family education;Manual techniques;Dry needling    PT Next Visit Plan manual work to improve pelvic floor circular contraction and left iliococcygeus, prolapse management, core strength, hip strength    Consulted and Agree with Plan of Care Patient             Patient will benefit from skilled therapeutic intervention in order to improve the following deficits and impairments:  Decreased coordination, Pain, Decreased activity tolerance, Decreased strength  Visit Diagnosis: Muscle weakness (generalized) - Plan: PT plan of care cert/re-cert  Other lack of coordination - Plan: PT plan of care cert/re-cert     Problem List Patient Active Problem List   Diagnosis Date Noted   PCB (post coital bleeding) 01/19/2021   SUI (stress urinary incontinence, female) 01/19/2021    Earlie Counts, PT 04/17/21 9:40 AM  [facility] @ [place], [place], [address] Phone: [phone]   Fax:  [phone]  Name: Lauren Stevens MRN: [id number] Date of Birth: 1971-02-05

## 2021-04-29 ENCOUNTER — Other Ambulatory Visit: Payer: Self-pay

## 2021-04-29 ENCOUNTER — Ambulatory Visit: Payer: BC Managed Care – PPO | Admitting: Physical Therapy

## 2021-04-29 ENCOUNTER — Encounter: Payer: Self-pay | Admitting: Physical Therapy

## 2021-04-29 DIAGNOSIS — N393 Stress incontinence (female) (male): Secondary | ICD-10-CM | POA: Diagnosis not present

## 2021-04-29 DIAGNOSIS — M6281 Muscle weakness (generalized): Secondary | ICD-10-CM

## 2021-04-29 DIAGNOSIS — R278 Other lack of coordination: Secondary | ICD-10-CM | POA: Diagnosis not present

## 2021-04-29 NOTE — Patient Instructions (Addendum)
EVB shorts  SRC restore support garment supporter Pelvic Pro  Access Code: [email or web address] URL: [email or web address] Date: 04/29/2021 Prepared by: Eulis Foster  Program Notes  EVB shorts  [facility] restore support garment supporter Pelvic Pro    Exercises Supine Transversus Abdominis Bracing - Hands on Stomach - 1 x daily - 7 x weekly - 1 sets - 10 reps Alternating Single Leg Bridge - 1 x daily - 3 x weekly - 1 sets - 10 reps Supine March - 1 x daily - 3 x weekly - 2 sets - 10 reps Quadruped Alternating Leg Extensions - 1 x daily - 3 x weekly - 2 sets - 10 reps Seated Pelvic Floor Contraction - 3 x daily - 7 x weekly - 1 sets - 10 reps - 5 sec hold [facility] [address], [address] [place], [place] [address] Phone # [phone] Fax [phone]

## 2021-04-29 NOTE — Therapy (Signed)
[facility] @ [place] [address] [place], [place], [address] Phone: [phone]   Fax:  [phone]  Physical Therapy Treatment  Patient Details  Name: Lauren Stevens MRN: [id number] Date of Birth: 1970/07/07 Referring Provider (PT): Dr. Valentina Shaggy   Encounter Date: 04/29/2021   PT End of Session - 04/29/21 1016     Visit Number 2    Date for PT Re-Evaluation 07/10/21    Authorization Type BCBS    Authorization - Visit Number 2    Authorization - Number of Visits 30    PT Start Time 0930    PT Stop Time 1010    PT Time Calculation (min) 40 min    Activity Tolerance Patient tolerated treatment well;No increased pain    Behavior During Therapy WFL for tasks assessed/performed             Past Medical History:  Diagnosis Date   STD (sexually transmitted disease)    HSV 2    Past Surgical History:  Procedure Laterality Date   BREAST BIOPSY Right 08/15/2014   adenosis/ PASH    There were no vitals filed for this visit.   Subjective Assessment - 04/29/21 0935     Subjective Not too many changes. I tried the tampon and may of helped a little.    Patient Stated Goals reduce urinary leakage    Currently in Pain? Yes    Pain Score 6     Pain Location Vagina    Pain Orientation Mid    Pain Descriptors / Indicators Contraction;Constant    Pain Type Acute pain    Pain Onset More than a month ago    Pain Frequency Intermittent    Aggravating Factors  deep penetrative penile penetration    Pain Relieving Factors no penile penetration    Multiple Pain Sites No                               [facility] Adult PT Treatment/Exercise - 04/29/21 0001       Self-Care   Self-Care Other Self-Care Comments    Other Self-Care Comments  discussed with patient on different support garments to support her Prolapse and gave her different websites; discussed about keeping bowel regular so she is not pushing on the  prolapse.      Lumbar Exercises: Aerobic   Stationary Bike level 2 for 5 minutes while assessing patient      Lumbar Exercises: Seated   Other Seated Lumbar Exercises sit pelvic floor contraction holding for 5 seconds 10x with verbal cues on how to feel the contraction      Lumbar Exercises: Supine   Ab Set 10 reps;5 seconds    AB Set Limitations with ball squeeze    Bent Knee Raise 10 reps;1 second    Bent Knee Raise Limitations with pelvic floor and abdominal contraction    Bridge 10 reps    Bridge Limitations with ball squeeze    Bridge with March 20 reps    Bridge with [employer] Limitations 10 without ball squeeze and 10 with    Other Supine Lumbar Exercises diaphragmatic breathing with 360 degree rib cage movement 15x      Lumbar Exercises: Quadruped   Straight Leg Raise 20 reps;1 second    Straight Leg Raises Limitations engaging core and pelvic floor contraction  PT Education - 04/29/21 1009     Education Details Access Code: [email or web address]    Person(s) Educated Patient    Methods Explanation;Demonstration;Verbal cues;Handout    Comprehension Returned demonstration;Verbalized understanding              PT Short Term Goals - 04/17/21 0934       PT SHORT TERM GOAL #1   Title independent with initial HEP for pelvic floor strength    Time 4    Period Weeks    Status New    Target Date 05/15/21      PT SHORT TERM GOAL #2   Title understand ways to manage prolapse with pressure management, correct body mechanics    Time 4    Period Weeks    Status New    Target Date 05/15/21               PT Long Term Goals - 04/17/21 0935       PT LONG TERM GOAL #1   Title independent with advanced HEP for core and pelvic floor strength to reduce leakage    Time 12    Period Weeks    Status New    Target Date 07/10/21      PT LONG TERM GOAL #2   Title urinary leakage in standing while she is working decreased >/= 50% due to improve  strength and coordination of pelvic floor    Time 12    Period Weeks    Status New    Target Date 07/10/21      PT LONG TERM GOAL #3   Title increased bilateral hip and core strength so she is able to reduce her prolapse 50% of the time and reduce her leakage    Time 12    Period Weeks    Status New    Target Date 07/10/21      PT LONG TERM GOAL #4   Title pain with intercourse is </= 0-1/10 due to reduction of trigger points in the pelvic floor    Time 12    Period Weeks    Status New    Target Date 07/10/21                   Plan - 04/29/21 1010     Clinical Impression Statement Patient has learned about garments that can assist her prolapse whiel in standing or working. Patient is working on her core with pelvic floor contraction. She was able to feel the pelvic floor contract in sitting to make sure she is doing the exercises correctly. Patient will benefit from skilled therapy to improve pelvic floor strength and coordination to reduce her leakage and manage her prolapse.    Personal Factors and Comorbidities Fitness;Profession    Examination-Activity Limitations Continence;Stand    Stability/Clinical Decision Making Stable/Uncomplicated    Rehab Potential Excellent    PT Frequency 1x / week   everyother week   PT Duration 12 weeks    PT Treatment/Interventions ADLs/Self Care Home Management;Biofeedback;Therapeutic activities;Therapeutic exercise;Neuromuscular re-education;Patient/family education;Manual techniques;Dry needling    PT Next Visit Plan manual work to improve pelvic floor circular contraction and left iliococcygeus, prolapse management, gluteus medius, dead bug,    PT Home Exercise Plan Access Code: [email or web address]    Recommended Other Services MD signed initial note    Consulted and Agree with Plan of Care Patient             Patient will benefit from skilled therapeutic intervention  in order to improve the following deficits and impairments:  Decreased  coordination, Pain, Decreased activity tolerance, Decreased strength  Visit Diagnosis: Muscle weakness (generalized)  Other lack of coordination     Problem List Patient Active Problem List   Diagnosis Date Noted   PCB (post coital bleeding) 01/19/2021   SUI (stress urinary incontinence, female) 01/19/2021    Eulis Fosterheryl Laetitia Schnepf, PT 04/29/21 10:16 AM   [place] @ [facility] [address] [place], [place], [address] Phone: [phone]   Fax:  [phone]  Name: Lauren Stevens MRN: [id number] Date of Birth: 11/17/1970

## 2021-05-11 ENCOUNTER — Encounter: Payer: BC Managed Care – PPO | Admitting: Physical Therapy

## 2021-05-12 ENCOUNTER — Other Ambulatory Visit (HOSPITAL_BASED_OUTPATIENT_CLINIC_OR_DEPARTMENT_OTHER): Payer: Self-pay | Admitting: Obstetrics & Gynecology

## 2021-05-12 MED ORDER — VALACYCLOVIR HCL 500 MG PO TABS: ORAL_TABLET | ORAL | 1 refills | Status: DC

## 2021-05-19 DIAGNOSIS — L821 Other seborrheic keratosis: Secondary | ICD-10-CM | POA: Diagnosis not present

## 2021-05-19 DIAGNOSIS — L719 Rosacea, unspecified: Secondary | ICD-10-CM | POA: Diagnosis not present

## 2021-05-19 DIAGNOSIS — D225 Melanocytic nevi of trunk: Secondary | ICD-10-CM | POA: Diagnosis not present

## 2021-05-19 DIAGNOSIS — L814 Other melanin hyperpigmentation: Secondary | ICD-10-CM | POA: Diagnosis not present

## 2021-05-23 IMAGING — MG DIGITAL SCREENING BILAT W/ TOMO W/ CAD
8 series · 8 of 24 positions shown · non-contrast
Comparison: Previous exam(s).

CLINICAL DATA: Screening.

EXAM:
DIGITAL SCREENING BILATERAL MAMMOGRAM WITH TOMO AND CAD

[L MLO synth-2D]
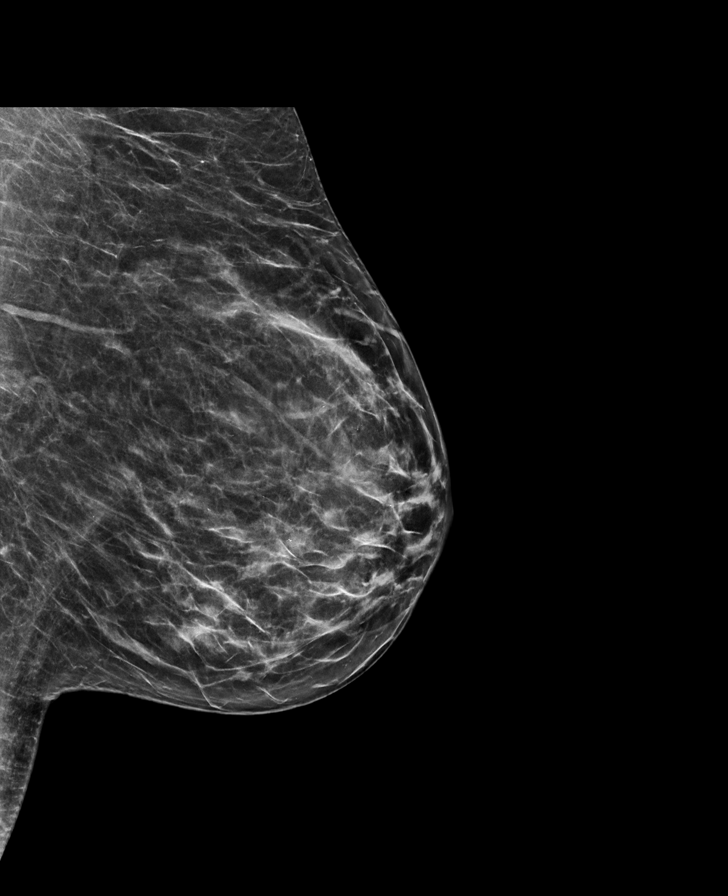

[L CC synth-2D]
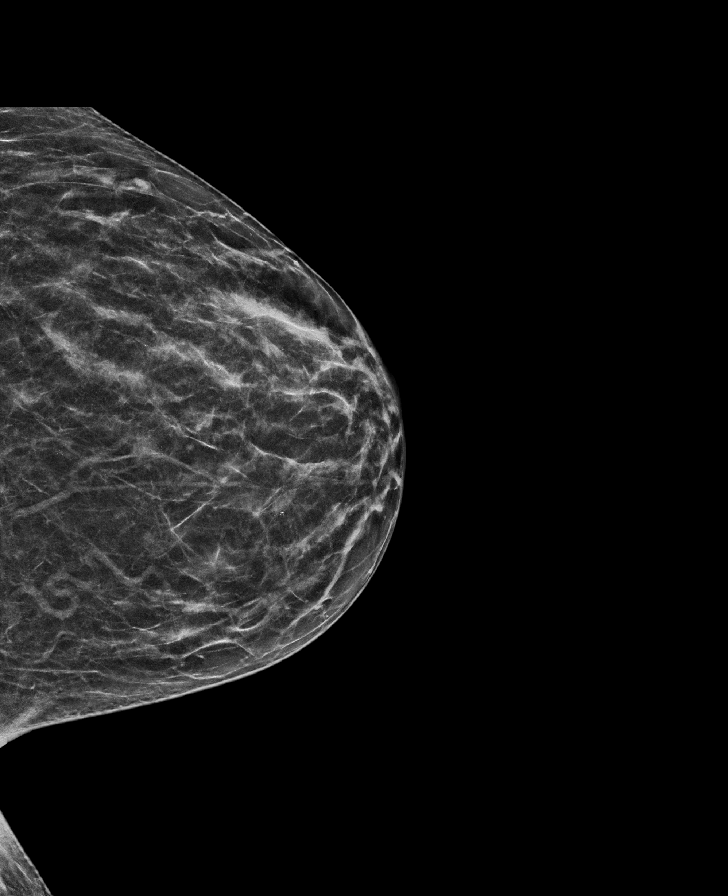

[R MLO synth-2D]
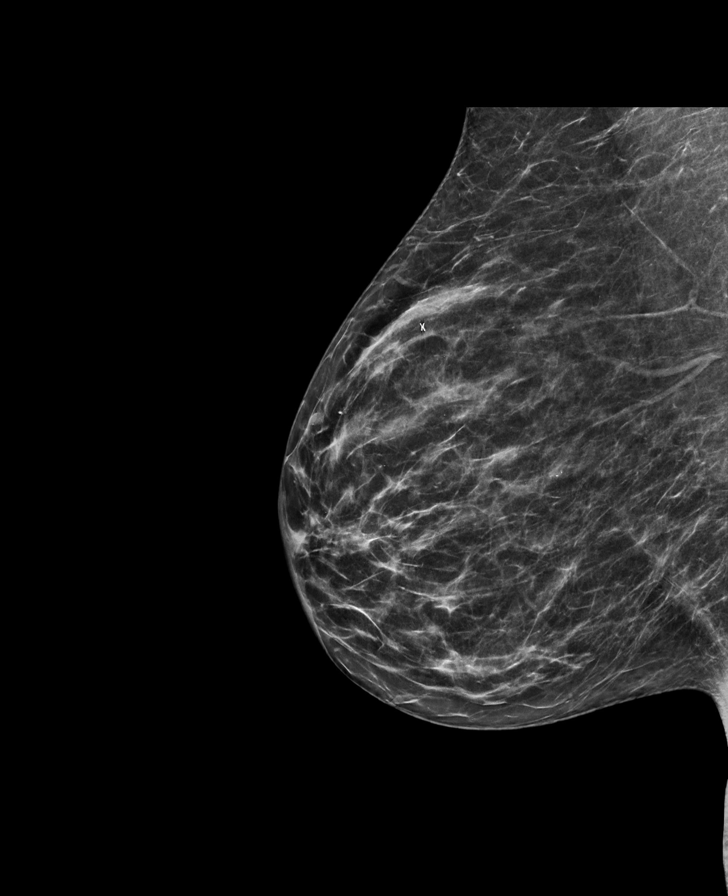

[R CC synth-2D]
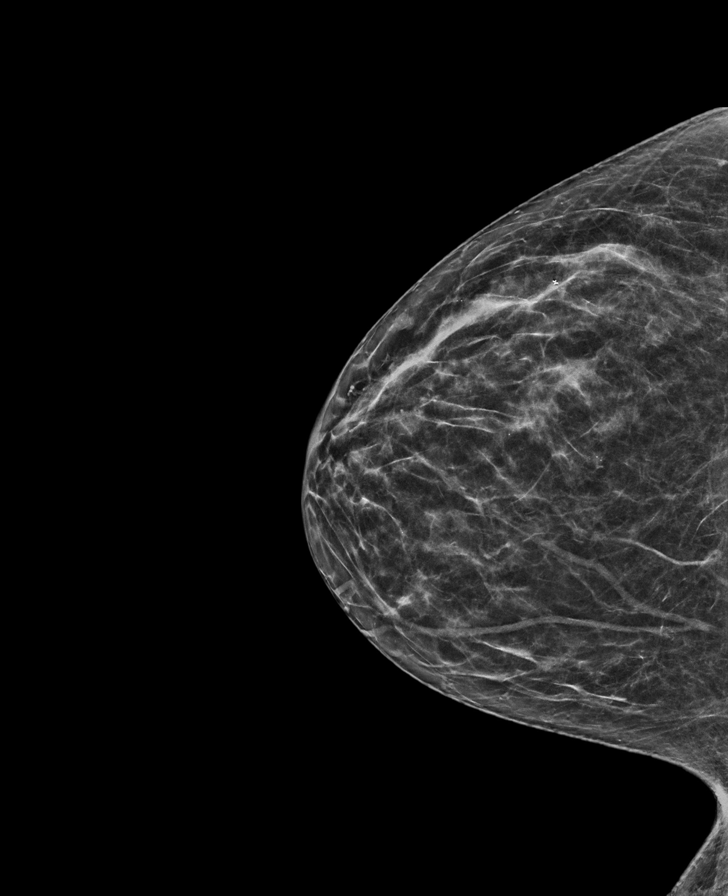

[L MLO tomo · tomo slice 33/65.0]
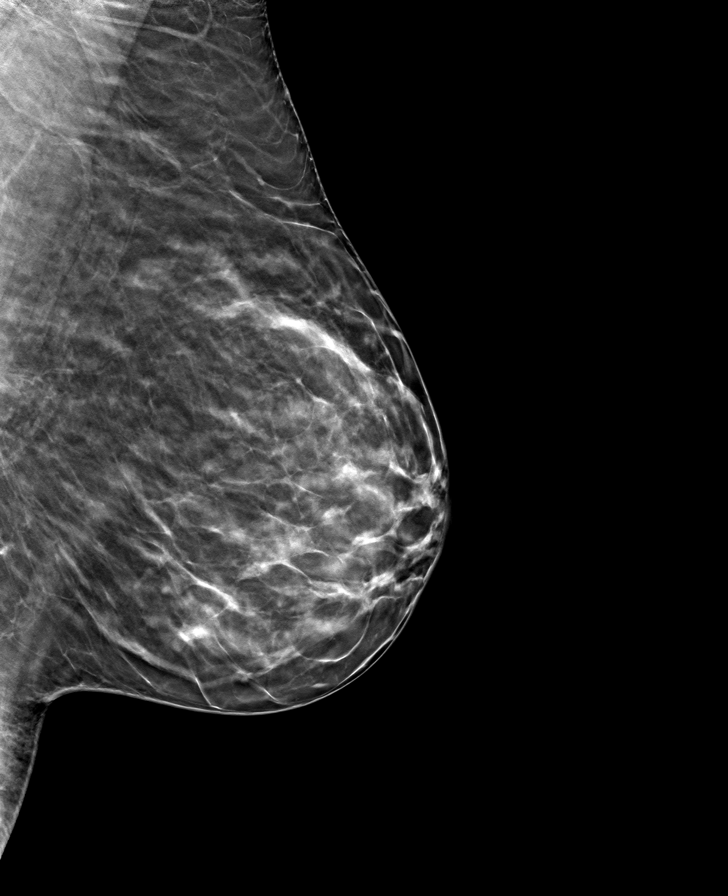

[R MLO tomo · tomo slice 31/61.0]
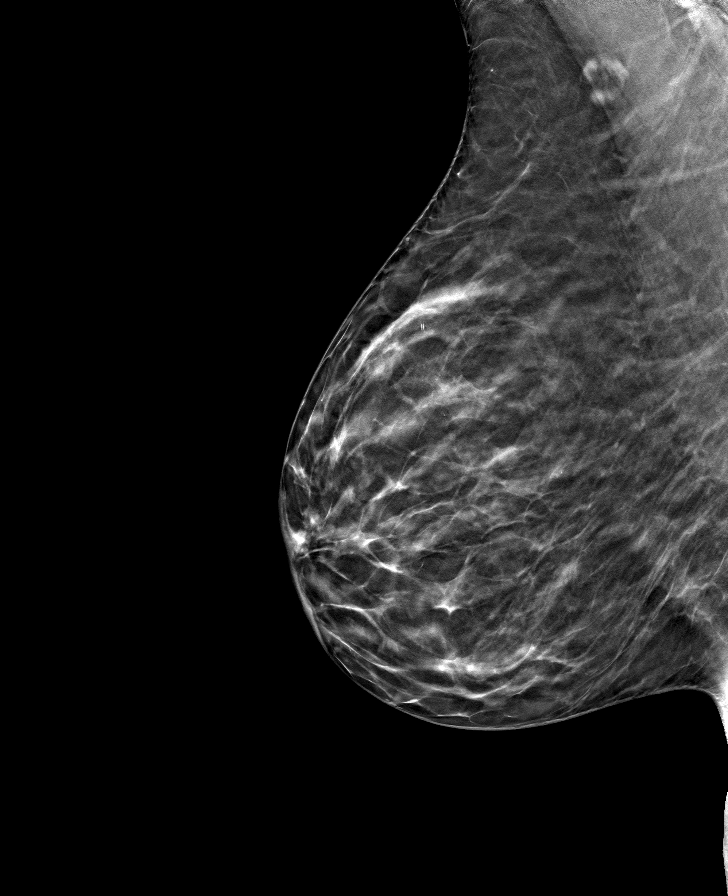

[R CC tomo · tomo slice 30/59.0]
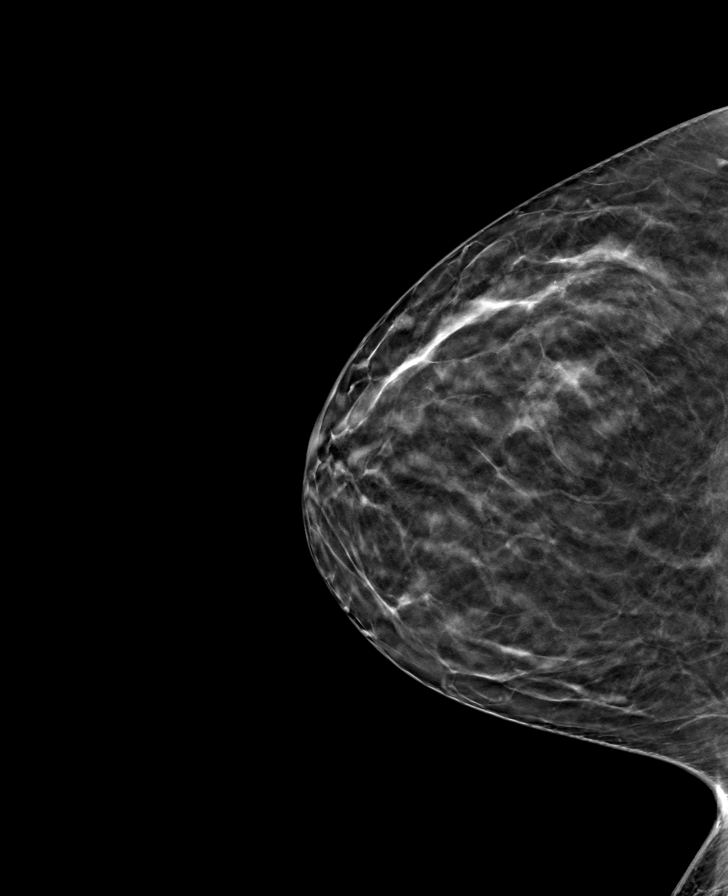

[L CC tomo · tomo slice 29/58.0]
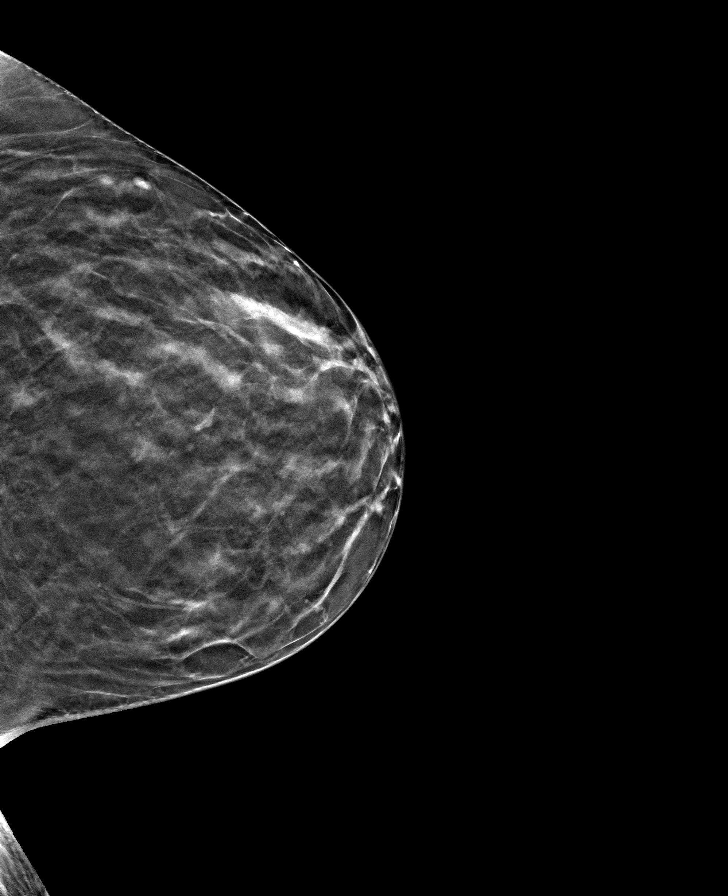

[8 of 24 positions shown; findings below may reference images not displayed]

ACR Breast Density Category b: There are scattered areas of
fibroglandular density.
FINDINGS: There are no findings suspicious for malignancy. Images were
processed with CAD.
IMPRESSION: No mammographic evidence of malignancy. A result letter of this
screening mammogram will be mailed directly to the patient.

RECOMMENDATION:
Screening mammogram in one year. (Code:[id number])

BI-RADS CATEGORY  1: Negative.

## 2021-05-27 ENCOUNTER — Encounter: Payer: BC Managed Care – PPO | Admitting: Physical Therapy

## 2021-06-10 ENCOUNTER — Encounter: Payer: BC Managed Care – PPO | Admitting: Physical Therapy

## 2021-06-24 ENCOUNTER — Encounter: Payer: BC Managed Care – PPO | Admitting: Physical Therapy

## 2021-07-08 ENCOUNTER — Encounter: Payer: BC Managed Care – PPO | Admitting: Physical Therapy

## 2021-09-01 ENCOUNTER — Other Ambulatory Visit: Payer: Self-pay | Admitting: Obstetrics & Gynecology

## 2021-09-01 ENCOUNTER — Other Ambulatory Visit: Payer: Self-pay | Admitting: Family Medicine

## 2021-09-01 DIAGNOSIS — Z1231 Encounter for screening mammogram for malignant neoplasm of breast: Secondary | ICD-10-CM

## 2021-09-29 DIAGNOSIS — R635 Abnormal weight gain: Secondary | ICD-10-CM | POA: Diagnosis not present

## 2021-09-29 DIAGNOSIS — N951 Menopausal and female climacteric states: Secondary | ICD-10-CM | POA: Diagnosis not present

## 2021-09-29 DIAGNOSIS — E559 Vitamin D deficiency, unspecified: Secondary | ICD-10-CM | POA: Diagnosis not present

## 2021-10-01 DIAGNOSIS — Z1339 Encounter for screening examination for other mental health and behavioral disorders: Secondary | ICD-10-CM | POA: Diagnosis not present

## 2021-10-01 DIAGNOSIS — Z1331 Encounter for screening for depression: Secondary | ICD-10-CM | POA: Diagnosis not present

## 2021-10-01 DIAGNOSIS — E559 Vitamin D deficiency, unspecified: Secondary | ICD-10-CM | POA: Diagnosis not present

## 2021-10-01 DIAGNOSIS — Z6829 Body mass index (BMI) 29.0-29.9, adult: Secondary | ICD-10-CM | POA: Diagnosis not present

## 2021-10-01 DIAGNOSIS — R7989 Other specified abnormal findings of blood chemistry: Secondary | ICD-10-CM | POA: Diagnosis not present

## 2021-10-01 DIAGNOSIS — R635 Abnormal weight gain: Secondary | ICD-10-CM | POA: Diagnosis not present

## 2021-11-02 ENCOUNTER — Ambulatory Visit
Admission: RE | Admit: 2021-11-02 | Discharge: 2021-11-02 | Disposition: A | Discharge status: Home / Self Care | Payer: BC Managed Care – PPO | Source: Ambulatory Visit | Attending: Obstetrics & Gynecology | Admitting: Obstetrics & Gynecology

## 2021-11-02 DIAGNOSIS — Z1231 Encounter for screening mammogram for malignant neoplasm of breast: Secondary | ICD-10-CM

## 2021-11-03 ENCOUNTER — Telehealth (HOSPITAL_BASED_OUTPATIENT_CLINIC_OR_DEPARTMENT_OTHER): Payer: Self-pay | Admitting: Obstetrics & Gynecology

## 2021-11-03 NOTE — Telephone Encounter (Signed)
Called patient and left  a message  to please call the office back to set up annual appointment in October.

## 2022-01-26 ENCOUNTER — Ambulatory Visit (INDEPENDENT_AMBULATORY_CARE_PROVIDER_SITE_OTHER): Payer: Self-pay | Admitting: *Deleted

## 2022-01-26 DIAGNOSIS — R319 Hematuria, unspecified: Secondary | ICD-10-CM

## 2022-01-26 DIAGNOSIS — R3915 Urgency of urination: Secondary | ICD-10-CM

## 2022-01-26 LAB — POCT URINALYSIS DIPSTICK
Bilirubin, UA: NEGATIVE
Glucose, UA: NEGATIVE
Ketones, UA: NEGATIVE
Nitrite, UA: NEGATIVE
Protein, UA: NEGATIVE
Spec Grav, UA: 1.025 (ref 1.010–1.025)
Urobilinogen, UA: 0.2 E.U./dL
pH, UA: 5 (ref 5.0–8.0)

## 2022-01-26 MED ORDER — NITROFURANTOIN MONOHYD MACRO 100 MG PO CAPS: 100.0000 mg | ORAL_CAPSULE | Freq: Two times a day (BID) | ORAL | 0 refills | Status: AC

## 2022-01-26 NOTE — Progress Notes (Signed)
Pt presents to office with complaints of ongoing urinary urgency. Clean catch urine obtained. Blood and leukocytes present. Rx sent to pharmacy for treatment of symptoms. Advised that urine will be sent for culture and will notify of results. Pt verbalized understanding.

## 2022-01-28 ENCOUNTER — Ambulatory Visit (HOSPITAL_BASED_OUTPATIENT_CLINIC_OR_DEPARTMENT_OTHER): Payer: Self-pay | Admitting: Obstetrics & Gynecology

## 2022-01-28 LAB — URINE CULTURE

## 2022-05-17 ENCOUNTER — Ambulatory Visit (HOSPITAL_BASED_OUTPATIENT_CLINIC_OR_DEPARTMENT_OTHER): Payer: BC Managed Care – PPO | Admitting: Obstetrics & Gynecology

## 2022-05-17 ENCOUNTER — Other Ambulatory Visit (HOSPITAL_COMMUNITY)
Admission: RE | Admit: 2022-05-17 | Discharge: 2022-05-17 | Disposition: A | Discharge status: Home / Self Care | Payer: BC Managed Care – PPO | Source: Ambulatory Visit | Attending: Obstetrics & Gynecology | Admitting: Obstetrics & Gynecology

## 2022-05-17 ENCOUNTER — Encounter (HOSPITAL_BASED_OUTPATIENT_CLINIC_OR_DEPARTMENT_OTHER): Payer: Self-pay | Admitting: Obstetrics & Gynecology

## 2022-05-17 VITALS — BP 125/83 | HR 64 | Ht 66.5 in | Wt 180.4 lb

## 2022-05-17 DIAGNOSIS — Z01419 Encounter for gynecological examination (general) (routine) without abnormal findings: Secondary | ICD-10-CM | POA: Diagnosis not present

## 2022-05-17 DIAGNOSIS — R3915 Urgency of urination: Secondary | ICD-10-CM | POA: Diagnosis not present

## 2022-05-17 DIAGNOSIS — Z124 Encounter for screening for malignant neoplasm of cervix: Secondary | ICD-10-CM | POA: Insufficient documentation

## 2022-05-17 DIAGNOSIS — N952 Postmenopausal atrophic vaginitis: Secondary | ICD-10-CM | POA: Diagnosis not present

## 2022-05-17 DIAGNOSIS — N393 Stress incontinence (female) (male): Secondary | ICD-10-CM | POA: Diagnosis not present

## 2022-05-17 MED ORDER — ESTRADIOL 0.1 MG/GM VA CREA: TOPICAL_CREAM | VAGINAL | 3 refills | Status: DC

## 2022-05-17 NOTE — Progress Notes (Unsigned)
52 y.o. PO:[id number] Single White or Caucasian female here for annual exam.  She finished school in May -- BS in [employer].  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  This is contract but hoping this will permanent.    Daughter is getting married in two weeks.    Last menstrual cycle was about a year ago.  Hasn't had any post coital bleeding.    Having some urinary urgency symptoms.  She did have a negative urine test in December.  We discussed possible causes of urgency changes related to hormonal changes.    No LMP.       Sexually active: Yes.    The current method of family planning is none.    Smoker:  no  Health Maintenance: Pap:  01/19/2021 Negative History of abnormal Pap:  no MMG:  11/02/2021 Negative Colonoscopy:  declines now.  Pt would like to wait until next year. Screening Labs: done in July   reports that she has never smoked. She has never used smokeless tobacco. She reports current alcohol use of about 5.0 - 6.0 standard drinks of alcohol per week. She reports that she does not use drugs.  Past Medical History:  Diagnosis Date   STD (sexually transmitted disease)    HSV 2    Past Surgical History:  Procedure Laterality Date   BREAST BIOPSY Right 08/15/2014   adenosis/ PASH    Current Outpatient Medications  Medication Sig Dispense Refill   doxycycline (PERIOSTAT) 20 MG tablet Take 20 mg by mouth 2 (two) times daily. As needed     valACYclovir (VALTREX) 500 MG tablet 1 tab BID x 3 days with outbreaks.  Can take daily for preventative therapy if needed. 30 tablet 1   No current facility-administered medications for this visit.    Family History  Problem Relation Age of Onset   Diabetes Mother    Hypertension Mother    Hyperlipidemia Mother    Kidney cancer Mother        ? removed right kidney   Heart failure Father    Diabetes Father    Rheum arthritis Brother    Heart attack Maternal Grandfather      ROS: Constitutional: negative Genitourinary: urinary urgency  Exam:   BP 125/83 (BP Location: Left Arm, Patient Position: Sitting, Cuff Size: Large)   Pulse 64   Ht 5' 6.5" (1.689 m) Comment: Reported  Wt 180 lb 6.4 oz (81.8 kg)   BMI 28.68 kg/m   Height: 5' 6.5" (168.9 cm) (Reported)  General appearance: alert, cooperative and appears stated age Head: Normocephalic, without obvious abnormality, atraumatic Neck: no adenopathy, supple, symmetrical, trachea midline and thyroid normal to inspection and palpation Lungs: clear to auscultation bilaterally Breasts: normal appearance, no masses or tenderness Heart: regular rate and rhythm Abdomen: soft, non-tender; bowel sounds normal; no masses,  no organomegaly Extremities: extremities normal, atraumatic, no cyanosis or edema Skin: Skin color, texture, turgor normal. No rashes or lesions Lymph nodes: Cervical, supraclavicular, and axillary nodes normal. No abnormal inguinal nodes palpated Neurologic: Grossly normal   Pelvic: External genitalia:  no lesions              Urethra:  normal appearing urethra with no masses, tenderness or lesions              Bartholins and Skenes: normal                 Vagina: normal appearing vagina with  normal color and no discharge, no lesions              Cervix: no lesions              Pap taken: Yes.   Bimanual Exam:  Uterus:  normal size, contour, position, consistency, mobility, non-tender              Adnexa: normal adnexa and no mass, fullness, tenderness               Rectovaginal: Confirms               Anus:  normal sphincter tone, no lesions  Chaperone, Octaviano Batty, CMA, was present for exam.  Assessment/Plan: 1. Well woman exam with routine gynecological exam - Pap smear and HR HPV obtained today - Mammogram 11/02/2021 - Colonoscopy declined today.  States she will consider doing next year. - Bone mineral density guidelines reviewed - lab work done in July, 2023, and pt will  get me copy of results - vaccines reviewed/updated  2. Cervical cancer screening - Cytology - PAP( [place])  3. Vaginal atrophy - trial of topical estrogen cream suggested. Pt willing to try.  Administration discussed.  Questions answered - estradiol (ESTRACE) 0.1 MG/GM vaginal cream; 1 gram vaginally twice weekly  Dispense: 42.5 g; Refill: 3  4. Urinary urgency - pt cannot go back to PT at this time due to insurance but aware I can/will refer her back at any time when ready.

## 2022-05-18 DIAGNOSIS — R3915 Urgency of urination: Secondary | ICD-10-CM | POA: Insufficient documentation

## 2022-05-18 DIAGNOSIS — N952 Postmenopausal atrophic vaginitis: Secondary | ICD-10-CM | POA: Insufficient documentation

## 2022-05-21 LAB — CYTOLOGY - PAP
Comment: NEGATIVE
Diagnosis: UNDETERMINED — AB
High risk HPV: POSITIVE — AB

## 2022-05-24 ENCOUNTER — Encounter: Payer: Self-pay | Admitting: *Deleted

## 2022-07-01 ENCOUNTER — Ambulatory Visit (HOSPITAL_BASED_OUTPATIENT_CLINIC_OR_DEPARTMENT_OTHER): Payer: BC Managed Care – PPO | Admitting: Obstetrics & Gynecology

## 2022-07-27 ENCOUNTER — Other Ambulatory Visit (HOSPITAL_COMMUNITY)
Admission: RE | Admit: 2022-07-27 | Discharge: 2022-07-27 | Disposition: A | Discharge status: Home / Self Care | Payer: BC Managed Care – PPO | Source: Ambulatory Visit | Attending: Obstetrics & Gynecology | Admitting: Obstetrics & Gynecology

## 2022-07-27 ENCOUNTER — Ambulatory Visit (INDEPENDENT_AMBULATORY_CARE_PROVIDER_SITE_OTHER): Payer: BC Managed Care – PPO | Admitting: Obstetrics & Gynecology

## 2022-07-27 DIAGNOSIS — R87810 Cervical high risk human papillomavirus (HPV) DNA test positive: Secondary | ICD-10-CM | POA: Diagnosis present

## 2022-07-27 DIAGNOSIS — R87610 Atypical squamous cells of undetermined significance on cytologic smear of cervix (ASC-US): Secondary | ICD-10-CM | POA: Diagnosis present

## 2022-07-27 NOTE — Progress Notes (Signed)
52 y.o. Lauren Stevens Single White female here for colposcopy with possible biopsies and/or ECC due to ASCUS Pap with HR HPV obtained 05/17/2022.    Prior evaluation/treatment:  none.  No LMP recorded. (Menstrual status: Other).          The current method of family planning is post menopausal status.     Patient has been counseled about results and procedure.  Risks and benefits have bene reviewed including immediate and/or delayed bleeding, infection, cervical scaring from procedure, possibility of needing additional follow up as well as treatment.  Rare risks of missing a lesion discussed as well.  All questions answered.  Pt ready to proceed.  Consent obtained.  There were no vitals taken for this visit.  General appearance: alert, cooperative and appears stated age Lymph nodes: No abnormal inguinal nodes palpated Neurologic: Grossly normal  Pelvic: External genitalia:  no lesions              Urethra:  normal appearing urethra with no masses, tenderness or lesions              Bartholins and Skenes: normal                 Vagina: normal appearing vagina with normal color and no discharge, no lesions               Physical Exam Exam conducted with a chaperone present.  Constitutional:      Appearance: Normal appearance.  Genitourinary:    Labia:        Right: No lesion.        Left: No lesion.      Lymphadenopathy:     Lower Body: No right inguinal adenopathy. No left inguinal adenopathy.  Neurological:     Mental Status: She is alert.     Speculum placed.  3% acetic acid applied to cervix for >45 seconds.  Cervix visualized with both 7.5X and 15X magnification.  Green filter also used.  Lugols solution was used.  Findings:  small area of decreased staining with Lugol's solution at 8 o'clock on cervix.  This was ectocervical.  Biopsy:  8 o'clock.  ECC:  was performed.  Monsel's was needed.  Excellent hemostasis was present.  Pt tolerated procedure well and all instruments were  removed.  Findings noted above on picture of cervix.  Chaperone, Raechel Ache, RN, was present during procedure.  Assessment/Plan: 1. ASCUS with positive high risk HPV cervical - Surgical pathology( [place] [facility]/ [facility]) - Pathology results will be called to patient and follow-up planned pending results.

## 2022-07-29 LAB — SURGICAL PATHOLOGY

## 2022-08-01 DIAGNOSIS — R87610 Atypical squamous cells of undetermined significance on cytologic smear of cervix (ASC-US): Secondary | ICD-10-CM | POA: Insufficient documentation

## 2023-04-20 ENCOUNTER — Other Ambulatory Visit: Payer: Self-pay | Admitting: Obstetrics & Gynecology

## 2023-04-20 DIAGNOSIS — Z1231 Encounter for screening mammogram for malignant neoplasm of breast: Secondary | ICD-10-CM

## 2023-05-03 ENCOUNTER — Other Ambulatory Visit (HOSPITAL_BASED_OUTPATIENT_CLINIC_OR_DEPARTMENT_OTHER): Payer: Self-pay

## 2023-05-03 DIAGNOSIS — B001 Herpesviral vesicular dermatitis: Secondary | ICD-10-CM

## 2023-05-03 MED ORDER — VALACYCLOVIR HCL 500 MG PO TABS
ORAL_TABLET | ORAL | 1 refills | Status: AC
Start: 2023-05-03 — End: ?

## 2023-05-06 ENCOUNTER — Ambulatory Visit: Payer: BC Managed Care – PPO

## 2023-05-26 ENCOUNTER — Ambulatory Visit (HOSPITAL_BASED_OUTPATIENT_CLINIC_OR_DEPARTMENT_OTHER): Payer: BC Managed Care – PPO | Admitting: Obstetrics & Gynecology

## 2023-06-09 ENCOUNTER — Ambulatory Visit: Payer: BC Managed Care – PPO

## 2023-06-22 ENCOUNTER — Ambulatory Visit (HOSPITAL_BASED_OUTPATIENT_CLINIC_OR_DEPARTMENT_OTHER): Payer: BC Managed Care – PPO | Admitting: Obstetrics & Gynecology

## 2023-06-22 ENCOUNTER — Encounter (HOSPITAL_BASED_OUTPATIENT_CLINIC_OR_DEPARTMENT_OTHER): Payer: Self-pay | Admitting: Obstetrics & Gynecology

## 2023-06-22 ENCOUNTER — Other Ambulatory Visit (HOSPITAL_COMMUNITY)
Admission: RE | Admit: 2023-06-22 | Discharge: 2023-06-22 | Disposition: A | Discharge status: Home / Self Care | Source: Ambulatory Visit | Attending: Obstetrics & Gynecology | Admitting: Obstetrics & Gynecology

## 2023-06-22 VITALS — BP 114/86 | HR 72 | Ht 66.5 in | Wt 192.8 lb

## 2023-06-22 DIAGNOSIS — R87610 Atypical squamous cells of undetermined significance on cytologic smear of cervix (ASC-US): Secondary | ICD-10-CM | POA: Diagnosis not present

## 2023-06-22 DIAGNOSIS — Z01419 Encounter for gynecological examination (general) (routine) without abnormal findings: Secondary | ICD-10-CM

## 2023-06-22 DIAGNOSIS — B001 Herpesviral vesicular dermatitis: Secondary | ICD-10-CM

## 2023-06-22 DIAGNOSIS — Z1211 Encounter for screening for malignant neoplasm of colon: Secondary | ICD-10-CM

## 2023-06-22 DIAGNOSIS — R17 Unspecified jaundice: Secondary | ICD-10-CM

## 2023-06-22 DIAGNOSIS — Z6830 Body mass index (BMI) 30.0-30.9, adult: Secondary | ICD-10-CM

## 2023-06-22 DIAGNOSIS — R87810 Cervical high risk human papillomavirus (HPV) DNA test positive: Secondary | ICD-10-CM | POA: Diagnosis present

## 2023-06-22 DIAGNOSIS — N952 Postmenopausal atrophic vaginitis: Secondary | ICD-10-CM | POA: Diagnosis not present

## 2023-06-22 NOTE — Progress Notes (Signed)
 ANNUAL EXAM Patient name: Lauren Stevens MRN [id number]  Date of birth: [birth year] Chief Complaint:   AEX, no complaints.    History of Present Illness:   Lauren Stevens is a 53 y.o. G74P1021 Caucasian female being seen today for a routine annual exam.  Doing well.  Has not had a cycle in over a year.  Not really having hot flashes.  But she is feeling a little more irritable.  She is having vaginal dryness.  Also feels like she is having some decreased libido.  Not using the vaginal estrogen with any regularity.   Does not need RF for valtrex.    Started munjaro.  Has gotten a sample.  Has blood work done at [facility] last year.    Patient's last menstrual period was 05/20/2022 (approximate).   Last pap 05/17/22. Results were: ASCUS w/ HRHPV positive: other (not 16, 18/45). H/O abnormal pap: yes Last mammogram: 11/02/21. Results were: normal. Family h/o breast cancer: no Last colonoscopy: not done.  Ready for referral.       06/22/2023   11:05 AM 05/17/2022    2:34 PM 01/19/2021    2:17 PM  Depression screen PHQ 2/9  Decreased Interest 0 0 0  Down, Depressed, Hopeless 0 0 0  PHQ - 2 Score 0 0 0    Review of Systems:   Pertinent items are noted in HPI  Denies vaginal discharge, vaginal bleeding, urinary issues and bowel changes, pelvic/abdominal pain  Pertinent History Reviewed:  Reviewed past medical,surgical, social and family history.   Reviewed problem list, medications and allergies. Physical Assessment:   Vitals:   06/22/23 1057  BP: 114/86  Pulse: 72  Weight: 192 lb 12.8 oz (87.5 kg)  Height: 5' 6.5" (1.689 m)  Body mass index is 30.65 kg/m.        Physical Examination:   General appearance - well appearing, and in no distress  Mental status - alert, oriented to person, place, and time  Psych:  She has a normal mood and affect  Skin - warm and dry, normal color, no suspicious lesions noted  Chest - effort normal, all lung fields clear to auscultation  bilaterally  Heart - normal rate and regular rhythm  Neck:  midline trachea, no thyromegaly or nodules  Breasts - breasts appear normal, no suspicious masses, no skin or nipple changes or  axillary nodes  Abdomen - soft, nontender, nondistended, no masses or organomegaly  Pelvic - VULVA: normal appearing vulva with no masses, tenderness or lesions  VAGINA: normal appearing vagina with normal color and discharge, no lesions  CERVIX: normal appearing cervix without discharge or lesions, no CMT  Thin prep pap is done with HR HPV cotesting  UTERUS: uterus is felt to be normal size, shape, consistency and nontender   ADNEXA: No adnexal masses or tenderness noted.  Rectal - normal rectal, good sphincter tone, no masses felt.   Extremities:  No swelling or varicosities noted  Chaperone present for exam  Assessment & Plan:  1. Well woman exam with routine gynecological exam (Primary) - Pap smear with HR HPV obtained today - Mammogram scheduled for March - Colonoscopy referral placed - lab work done today - vaccines reviewed/updated  2. ASCUS with positive high risk HPV cervical - Cytology - PAP( [place])  3. Vaginal atrophy - has rx for vaginal estradiol cream  4. Recurrent cold sores - does not need rx for valtrex  5. BMI 30.0-30.9,adult - Hemoglobin A1c - Comprehensive metabolic panel -  Lipid panel  6. Colon cancer screening - Ambulatory referral to Gastroenterology   Meds: No orders of the defined types were placed in this encounter.   Follow-up: Return in about 1 year (around 06/21/2024).  Jerene Bears, MD 06/22/2023 11:39 AM

## 2023-06-23 LAB — COMPREHENSIVE METABOLIC PANEL
ALT: 20 IU/L (ref 0–32)
AST: 18 IU/L (ref 0–40)
Albumin: 4.5 g/dL (ref 3.8–4.9)
Alkaline Phosphatase: 74 IU/L (ref 44–121)
BUN/Creatinine Ratio: 19 (ref 9–23)
BUN: 16 mg/dL (ref 6–24)
Bilirubin Total: 1.3 mg/dL — ABNORMAL HIGH (ref 0.0–1.2)
CO2: 23 mmol/L (ref 20–29)
Calcium: 9.7 mg/dL (ref 8.7–10.2)
Chloride: 101 mmol/L (ref 96–106)
Creatinine, Ser: 0.83 mg/dL (ref 0.57–1.00)
Globulin, Total: 2.3 g/dL (ref 1.5–4.5)
Glucose: 77 mg/dL (ref 70–99)
Potassium: 4.9 mmol/L (ref 3.5–5.2)
Sodium: 140 mmol/L (ref 134–144)
Total Protein: 6.8 g/dL (ref 6.0–8.5)
eGFR: 85 mL/min/{1.73_m2} (ref >59)

## 2023-06-23 LAB — LIPID PANEL
Chol/HDL Ratio: 2.2 ratio (ref 0.0–4.4)
Cholesterol, Total: 196 mg/dL (ref 100–199)
HDL: 88 mg/dL (ref >39)
LDL Chol Calc (NIH): 98 mg/dL (ref 0–99)
Triglycerides: 50 mg/dL (ref 0–149)
VLDL Cholesterol Cal: 10 mg/dL (ref 5–40)

## 2023-06-23 LAB — HEMOGLOBIN A1C
Est. average glucose Bld gHb Est-mCnc: 91 mg/dL
Hgb A1c MFr Bld: 4.8 % (ref 4.8–5.6)

## 2023-06-24 ENCOUNTER — Encounter (HOSPITAL_BASED_OUTPATIENT_CLINIC_OR_DEPARTMENT_OTHER): Payer: Self-pay | Admitting: Obstetrics & Gynecology

## 2023-06-24 LAB — CYTOLOGY - PAP
Comment: NEGATIVE
Diagnosis: NEGATIVE
Diagnosis: REACTIVE
High risk HPV: NEGATIVE

## 2023-06-24 NOTE — Addendum Note (Signed)
 Addended by: Jerene Bears on: 06/24/2023 06:26 AM   Modules accepted: Orders

## 2023-07-07 ENCOUNTER — Ambulatory Visit
Admission: RE | Admit: 2023-07-07 | Discharge: 2023-07-07 | Disposition: A | Discharge status: Home / Self Care | Source: Ambulatory Visit | Attending: Obstetrics & Gynecology | Admitting: Obstetrics & Gynecology

## 2023-07-07 DIAGNOSIS — Z1231 Encounter for screening mammogram for malignant neoplasm of breast: Secondary | ICD-10-CM

## 2023-07-12 ENCOUNTER — Other Ambulatory Visit (HOSPITAL_BASED_OUTPATIENT_CLINIC_OR_DEPARTMENT_OTHER)

## 2023-07-13 ENCOUNTER — Other Ambulatory Visit (HOSPITAL_BASED_OUTPATIENT_CLINIC_OR_DEPARTMENT_OTHER)

## 2023-07-13 DIAGNOSIS — R17 Unspecified jaundice: Secondary | ICD-10-CM

## 2023-07-14 LAB — HEPATIC FUNCTION PANEL
ALT: 14 IU/L (ref 0–32)
AST: 17 IU/L (ref 0–40)
Albumin: 4.6 g/dL (ref 3.8–4.9)
Alkaline Phosphatase: 62 IU/L (ref 44–121)
Bilirubin Total: 1.2 mg/dL (ref 0.0–1.2)
Bilirubin, Direct: 0.35 mg/dL (ref 0.00–0.40)
Total Protein: 6.9 g/dL (ref 6.0–8.5)

## 2023-07-15 ENCOUNTER — Encounter (HOSPITAL_BASED_OUTPATIENT_CLINIC_OR_DEPARTMENT_OTHER): Payer: Self-pay | Admitting: Obstetrics & Gynecology

## 2023-12-15 ENCOUNTER — Other Ambulatory Visit (HOSPITAL_BASED_OUTPATIENT_CLINIC_OR_DEPARTMENT_OTHER): Payer: Self-pay | Admitting: Obstetrics & Gynecology

## 2023-12-15 DIAGNOSIS — N952 Postmenopausal atrophic vaginitis: Secondary | ICD-10-CM

## 2023-12-19 ENCOUNTER — Telehealth (HOSPITAL_BASED_OUTPATIENT_CLINIC_OR_DEPARTMENT_OTHER): Payer: Self-pay | Admitting: Obstetrics & Gynecology

## 2023-12-19 NOTE — Telephone Encounter (Signed)
 Spoke with patient. Advised rx for estradiol  vaginal cream was sent to [facility] on [address] on 12/16/2023. Patient will contact her pharmacy to see if she can get this filled at this time. She will return call if there are any problems filling the prescription.

## 2023-12-19 NOTE — Telephone Encounter (Signed)
 New message   1. Which medications need to be refilled? (please list name of each medication and dose if known) estradiol  (ESTRACE ) 0.1 MG/GM vaginal cream    2.  Which pharmacy/location (including street and city if local pharmacy) is medication to be sent to [facility] on [address] / [address]    3. Do they need a 30 day or 90 day supply? 90 days supply

## 2023-12-26 ENCOUNTER — Encounter (HOSPITAL_BASED_OUTPATIENT_CLINIC_OR_DEPARTMENT_OTHER): Payer: Self-pay | Admitting: Obstetrics & Gynecology

## 2023-12-26 ENCOUNTER — Ambulatory Visit (HOSPITAL_BASED_OUTPATIENT_CLINIC_OR_DEPARTMENT_OTHER): Admitting: Obstetrics & Gynecology

## 2023-12-26 VITALS — BP 121/81 | HR 79 | Ht 66.0 in | Wt 166.0 lb

## 2023-12-26 DIAGNOSIS — R350 Frequency of micturition: Secondary | ICD-10-CM | POA: Diagnosis not present

## 2023-12-26 DIAGNOSIS — R3915 Urgency of urination: Secondary | ICD-10-CM

## 2023-12-26 DIAGNOSIS — Z87448 Personal history of other diseases of urinary system: Secondary | ICD-10-CM | POA: Diagnosis not present

## 2023-12-26 LAB — POCT URINALYSIS DIP (CLINITEK)
Bilirubin, UA: NEGATIVE
Glucose, UA: NEGATIVE mg/dL
Ketones, POC UA: NEGATIVE mg/dL
Leukocytes, UA: NEGATIVE
Nitrite, UA: NEGATIVE
POC PROTEIN,UA: NEGATIVE
Spec Grav, UA: >=1.03 — AB (ref 1.010–1.025)
Urobilinogen, UA: 0.2 U/dL
pH, UA: 6 (ref 5.0–8.0)

## 2023-12-26 MED ORDER — MIRABEGRON ER 25 MG PO TB24
25.0000 mg | ORAL_TABLET | Freq: Every day | ORAL | 1 refills | Status: AC
Start: 2023-12-26 — End: ?

## 2023-12-26 NOTE — Progress Notes (Signed)
 GYNECOLOGY  VISIT  CC:   urinary urgency  HPI: 53 y.o. G40P1021 Single White or Caucasian female here for complaints of urinary urgency.  She does have some vaginal estradiol  cream but she has been using this just when she feels she needs this.  She does drink coffee in the morning and water but she has this feeling anytime she drinks anything else.  This happened two years ago and urine culture was negative.  She did have small amount of blood present but the testing was a POCT.  Will check micro today as well.  Doesn't really have urinary leaking.  She was having more issues with urinary incontinence prior to losing weight but that is improved.  Family member works in urology office.  Has questions about OAB.  Discussed this as well as possible IC.  Feels she may need urogyn or urology evaluation.  Discussed trial of medication for OAB.  Side effects discussed.     Past Medical History:  Diagnosis Date   STD (sexually transmitted disease)    HSV 2    MEDS:   Current Outpatient Medications on File Prior to Visit  Medication Sig Dispense Refill   estradiol  (ESTRACE ) 0.1 MG/GM vaginal cream USE 1 GRAM VAGINALLY 2 TIMES A WEEK 42.5 g 3   valACYclovir  (VALTREX ) 500 MG tablet 1 tab BID x 3 days with outbreaks.  Can take daily for preventative therapy if needed. 30 tablet 1   doxycycline (PERIOSTAT) 20 MG tablet Take 20 mg by mouth 2 (two) times daily. As needed (Patient not taking: Reported on 12/26/2023)     No current facility-administered medications on file prior to visit.    ALLERGIES: Penicillins, Other, and Tetracyclines & related  SH:  single, non smoker  Review of Systems  Constitutional: Negative.   Genitourinary:  Positive for frequency and urgency.    PHYSICAL EXAMINATION:    BP 121/81 (BP Location: Left Arm, Patient Position: Sitting)   Pulse 79   Ht 5' 6 (1.676 m)   Wt 166 lb (75.3 kg)   SpO2 100%   BMI 26.79 kg/m     Physical Exam Constitutional:      Appearance:  Normal appearance.  Neurological:     General: No focal deficit present.  Psychiatric:        Mood and Affect: Mood normal.        Behavior: Behavior normal.    Assessment/Plan: 1. Urinary frequency (Primary) - POCT URINALYSIS DIP (CLINITEK) - Urine Culture - mirabegron  ER (MYRBETRIQ ) 25 MG TB24 tablet; Take 1 tablet (25 mg total) by mouth daily. One po qd  Dispense: 30 tablet; Refill: 1 - she will restart vaginal estradiol  cream 1 gram pv twice weekly.  Advised to not stop use until after evaluation is completed  2. History of hematuria - Urine Microscopic Only  3. Urinary urgency - Ambulatory referral to Urogynecology

## 2023-12-28 ENCOUNTER — Ambulatory Visit (HOSPITAL_BASED_OUTPATIENT_CLINIC_OR_DEPARTMENT_OTHER)

## 2023-12-28 LAB — URINE CULTURE

## 2023-12-29 ENCOUNTER — Ambulatory Visit (HOSPITAL_BASED_OUTPATIENT_CLINIC_OR_DEPARTMENT_OTHER)

## 2023-12-29 NOTE — Progress Notes (Signed)
 Patient returned to office to provide additional urine sample for urinalysis. Urine sample collected and urinalysis sent for evaluation via [facility].

## 2023-12-30 LAB — URINALYSIS, MICROSCOPIC ONLY
Bacteria, UA: NONE SEEN
Casts: NONE SEEN /LPF
Epithelial Cells (non renal): NONE SEEN /HPF (ref 0–10)
RBC, Urine: NONE SEEN /HPF (ref 0–2)
WBC, UA: NONE SEEN /HPF (ref 0–5)

## 2023-12-31 ENCOUNTER — Ambulatory Visit (HOSPITAL_BASED_OUTPATIENT_CLINIC_OR_DEPARTMENT_OTHER): Payer: Self-pay | Admitting: Obstetrics & Gynecology

## 2024-04-24 DIAGNOSIS — N941 Unspecified dyspareunia: Secondary | ICD-10-CM | POA: Insufficient documentation

## 2024-04-24 NOTE — Progress Notes (Unsigned)
 " New Patient Evaluation and Consultation  Referring Provider: Cleotilde Ronal RAMAN, MD PCP: Patient, No Pcp Per Date of Service: 04/25/2024  SUBJECTIVE Chief Complaint: No chief complaint on file.  History of Present Illness: Lauren Stevens is a 54 y.o. White or Caucasian female seen in consultation at the request of Dr Cleotilde for evaluation of urinary urgency.    Evaluation to rule out interstitial cystitis due to burning with urination and sensation to urinary frequently S/p pelvic floor PT x 2 in 2023 and prior inconsistent vaginal estrogen use PRN 12/26/23 UA +heme, UA microscopy negative, culture <10K cfu growth Evaluated by Dr. Cleotilde and started mirabegron  25mg  daily and vaginal estrogen 1g twice weekly  ***Review of records significant for: ***HSV  Urinary Symptoms: Leaks urine with cough/ sneeze and exercise, improved since weight loss Leaks *** time(s) per {days/wks/mos/yrs:310907}.  Denies Pad use   Patient is not bothered by UI symptoms.  Day time voids ***.  Nocturia: 0-1 times per night to void. Voiding dysfunction:  empties bladder well.  Patient does not use a catheter to empty bladder.  When urinating, patient feels dribbling after finishing Drinks: ***oz water per day  UTIs: 0 UTI's in the last year.   {ACTIONS;DENIES/REPORTS:21021675::Denies} history of blood in urine, kidney or bladder stones, pyelonephritis, bladder cancer, and kidney cancer No results found for the last 90 days.   Pelvic Organ Prolapse Symptoms:                  Patient Denies a feeling of a bulge the vaginal area.   Bowel Symptom: Bowel movements: 1 time(s) per day Stool consistency: soft  Straining: no.  Splinting: no.  Incomplete evacuation: no.  Patient Denies accidental bowel leakage / fecal incontinence Bowel regimen: {bowel regimen:24759} Last colonoscopy: Date ***, Results *** [facility] Colonoscopy          Current Care Gaps     Colonoscopy (Every 10 Years) Never done    No  completion history exists for this topic.                 Sexual Function Sexually active: yes.  Sexual orientation: Straight Pain with sex: deep in the pelvis, has discomfort due to dryness  Pelvic Pain Denies pelvic pain   Past Medical History:  Past Medical History:  Diagnosis Date   STD (sexually transmitted disease)    HSV 2     Past Surgical History:   Past Surgical History:  Procedure Laterality Date   BREAST BIOPSY Right 08/15/2014   adenosis/ PASH     Past OB/GYN History: OB History  Gravida Para Term Preterm AB Living  3 1 1  0 2 1  SAB IAB Ectopic Multiple Live Births  0 0 0 0 1    # Outcome Date GA Lbr Len/2nd Weight Sex Type Anes PTL Lv  3 Term 1991   7 lb 10 oz (3.459 kg) F Vag-Spont   LIV  2 AB           1 AB             Vaginal deliveries: ***,  Forceps/ Vacuum deliveries: ***, Cesarean section: 0 Menopausal: Yes, at age 73, Admits to vaginal bleeding since menopause Contraception: s/p menopause. Last pap smear.  Any history of abnormal pap smears: yes.    Component Value Date/Time   DIAGPAP  06/22/2023 1117    - Negative for Intraepithelial Lesions or Malignancy (NILM)   DIAGPAP - Benign reactive/reparative changes 06/22/2023 1117  DIAGPAP (A) 05/17/2022 1513    - Atypical squamous cells of undetermined significance (ASC-US )   HPVHIGH Negative 06/22/2023 1117   HPVHIGH Positive (A) 05/17/2022 1513   ADEQPAP  06/22/2023 1117    Satisfactory for evaluation; transformation zone component PRESENT.   ADEQPAP  05/17/2022 1513    Satisfactory for evaluation; transformation zone component PRESENT.   ADEQPAP  01/19/2021 1501    Satisfactory for evaluation; transformation zone component PRESENT.    Medications: Patient has a current medication list which includes the following prescription(s): doxycycline, estradiol , mirabegron  er, and valacyclovir .   Allergies: Patient is allergic to penicillins, other, and tetracyclines & related.    Social History: Social History[1]  Relationship status: single Patient lives with ***.   Patient is employed ***. Regular exercise: No History of abuse: No  Family History:   Family History  Problem Relation Age of Onset   Diabetes Mother    Hypertension Mother    Hyperlipidemia Mother    Kidney cancer Mother        ? removed right kidney   Heart failure Father    Diabetes Father    Rheum arthritis Brother    Heart attack Maternal Grandfather      Review of Systems: Review of Systems  Constitutional:  Positive for malaise/fatigue. Negative for fever and weight loss.  Respiratory:  Negative for cough, shortness of breath and wheezing.   Cardiovascular:  Negative for chest pain, palpitations and leg swelling.  Gastrointestinal:  Negative for abdominal pain, blood in stool and constipation.  Genitourinary:  Positive for dysuria and urgency. Negative for frequency and hematuria.       Leakage  Skin:  Negative for rash.  Neurological:  Positive for dizziness and headaches. Negative for weakness.  Endo/Heme/Allergies:  Bruises/bleeds easily.       Hot flashes  Psychiatric/Behavioral:  Negative for depression. The patient is not nervous/anxious.      OBJECTIVE Physical Exam: There were no vitals filed for this visit.  Physical Exam Constitutional:      General: She is not in acute distress.    Appearance: Normal appearance.  Genitourinary:     Bladder and urethral meatus normal.     No lesions in the vagina.     Right Labia: No rash, tenderness, lesions, skin changes or Bartholin's cyst.    Left Labia: No tenderness, lesions, skin changes, Bartholin's cyst or rash.    No vaginal discharge, erythema, tenderness, bleeding, ulceration or granulation tissue.      Right Adnexa: not tender, not full and no mass present.    Left Adnexa: not tender, not full and no mass present.    No cervical motion tenderness, discharge, friability, lesion, polyp or nabothian cyst.      Uterus is not enlarged, fixed, tender or irregular.     No uterine mass detected.    Urethral meatus caruncle not present.    No urethral prolapse, tenderness, mass, hypermobility or discharge present.     Bladder is not tender, urgency on palpation not present and masses not present.      Levator ani not tender, obturator internus not tender, no asymmetrical contractions present and no pelvic spasms present.    Anal wink present and BC reflex present. Cardiovascular:     Rate and Rhythm: Normal rate.  Pulmonary:     Effort: Pulmonary effort is normal. No respiratory distress.  Abdominal:     General: There is no distension.     Palpations: There is no mass.  Tenderness: There is no abdominal tenderness.     Hernia: No hernia is present.  Neurological:     Mental Status: She is alert.  Vitals reviewed. Exam conducted with a chaperone present.      POP-Q:   POP-Q                                               Aa                                               Ba                                                 C                                                Gh                                               Pb                                               tvl                                                Ap                                               Bp                                                 D      Rectal Exam:  Normal sphincter tone, {rectocele:24766} distal rectocele, enterocoele {DESC; PRESENT/NOT PRESENT:21021351}, no rectal masses, {sign of:24767} dyssynergia when asking the patient to bear down.  Post-Void Residual (PVR) by Bladder Scan: In order to evaluate bladder emptying, we discussed obtaining a postvoid residual and patient agreed to this procedure.  Procedure: The ultrasound unit was placed on the patient's abdomen in the suprapubic region after the patient had voided.      Laboratory Results: Lab Results  Component Value Date    COLORU yellow 12/26/2023   CLARITYU clear 12/26/2023   GLUCOSEUR negative 12/26/2023   BILIRUBINUR negative 12/26/2023   KETONESU neg 01/26/2022   SPECGRAV >=1.030 (A) 12/26/2023   RBCUR small (A) 12/26/2023  PHUR 6.0 12/26/2023   PROTEINUR Negative 01/26/2022   UROBILINOGEN 0.2 12/26/2023   LEUKOCYTESUR Negative 12/26/2023    Lab Results  Component Value Date   CREATININE 0.83 06/22/2023   CREATININE 0.84 01/21/2021   CREATININE 0.91 07/06/2016    Lab Results  Component Value Date   HGBA1C 4.8 06/22/2023    Lab Results  Component Value Date   HGB 14.8 01/21/2021     ASSESSMENT AND PLAN Ms. Nicklas is a 54 y.o. with: No diagnosis found.  There are no diagnoses linked to this encounter.   Lianne ONEIDA Gillis, MD        [1]  Social History Tobacco Use   Smoking status: Never   Smokeless tobacco: Never  Vaping Use   Vaping status: Never Used  Substance Use Topics   Alcohol use: Not Currently    Comment: occasional   Drug use: No   "

## 2024-04-25 ENCOUNTER — Ambulatory Visit: Admitting: Obstetrics

## 2024-04-25 DIAGNOSIS — N952 Postmenopausal atrophic vaginitis: Secondary | ICD-10-CM

## 2024-04-25 DIAGNOSIS — N941 Unspecified dyspareunia: Secondary | ICD-10-CM

## 2024-04-25 DIAGNOSIS — R3915 Urgency of urination: Secondary | ICD-10-CM

## 2024-05-23 ENCOUNTER — Ambulatory Visit: Admitting: Obstetrics

## 2024-06-26 ENCOUNTER — Ambulatory Visit (HOSPITAL_BASED_OUTPATIENT_CLINIC_OR_DEPARTMENT_OTHER): Admitting: Obstetrics & Gynecology
